# Patient Record
Sex: Female | Born: 1980 | Race: Black or African American | Hispanic: No | Marital: Single | Smoking: Former smoker
Health system: Southern US, Community
[De-identification: ages and names within clinical notes are randomized; demographics above are authoritative.]

## PROBLEM LIST (undated history)

## (undated) DIAGNOSIS — B009 Herpesviral infection, unspecified: Secondary | ICD-10-CM

## (undated) HISTORY — PX: NO PAST SURGERIES: SHX2092

---

## 2013-01-04 ENCOUNTER — Ambulatory Visit: Payer: Self-pay | Admitting: Internal Medicine

## 2014-09-29 ENCOUNTER — Ambulatory Visit
Admission: EM | Admit: 2014-09-29 | Discharge: 2014-09-29 | Disposition: A | Discharge status: Home / Self Care | Payer: BLUE CROSS/BLUE SHIELD | Attending: Family Medicine | Admitting: Family Medicine

## 2014-09-29 DIAGNOSIS — M67432 Ganglion, left wrist: Secondary | ICD-10-CM

## 2014-09-29 NOTE — ED Notes (Signed)
Pt states "I have had knot in my left wrist for the last two weeks. I was sent to urgent care in [place] by my employer, but it turned out to be cyst, it is very painful. My employer has now sent me here for another evaluation."

## 2014-09-29 NOTE — ED Provider Notes (Signed)
CSN: [id number]     Arrival date & time 09/29/14  1530 History   First MD Initiated Contact with Patient 09/29/14 1609     Chief Complaint  Patient presents with  . Wrist Pain   (Consider location/radiation/quality/duration/timing/severity/associated sxs/prior Treatment) Patient is a 34 y.o. female presenting with wrist pain.  Wrist Pain This is a new problem. The current episode started more than 1 week ago. The problem occurs constantly. Associated symptoms comments: Patient reports wrist pain and numbness of fingers related to lump on back of wrist for the past 2 weeks.Marland Kitchen.    History reviewed. No pertinent past medical history. History reviewed. No pertinent past surgical history. No family history on file. History  Substance Use Topics  . Smoking status: Light Tobacco Smoker    Types: Cigars  . Smokeless tobacco: Not on file  . Alcohol Use: Yes     Comment: rare   OB History    No data available     Review of Systems  Allergies  Naproxen and Percocet  Home Medications   Prior to Admission medications   Not on File   BP 130/78 mmHg  Pulse 93  Temp(Src) 98.2 F (36.8 C) (Tympanic)  Resp 16  Ht 5\' 3"  (1.6 m)  Wt 201 lb (91.173 kg)  BMI 35.61 kg/m2  SpO2 99%  LMP 08/29/2014 Physical Exam  Constitutional: She appears well-developed and well-nourished. No distress.  Musculoskeletal:       Right wrist: She exhibits tenderness (over subcutaneous soft tissue mass), swelling (mild) and deformity (soft tissue subcutaneous mass over dorsal medial aspect of wrist). She exhibits normal range of motion, no bony tenderness, no effusion, no crepitus and no laceration.  Skin: No rash noted. She is not diaphoretic.  Nursing note reviewed.   ED Course  Procedures (including critical care time) Labs Review Labs Reviewed - No data to display  Imaging Review No results found.   MDM   1. Ganglion of left wrist     Plan: 1. Diagnosis reviewed with patient 2. Recommend  supportive treatment with otc NSAIDS prn, warm compresses 3. Recommend follow up with hand orthopedic specialist for further evaluation and management  Payton Mccallumrlando Keylon Labelle, MD 09/29/14 [phone]

## 2014-09-29 NOTE — Discharge Instructions (Signed)

## 2015-01-17 ENCOUNTER — Encounter: Payer: Self-pay | Admitting: Emergency Medicine

## 2015-01-17 ENCOUNTER — Ambulatory Visit
Admission: EM | Admit: 2015-01-17 | Discharge: 2015-01-17 | Disposition: A | Discharge status: Home / Self Care | Payer: BLUE CROSS/BLUE SHIELD | Attending: Family Medicine | Admitting: Family Medicine

## 2015-01-17 DIAGNOSIS — M62830 Muscle spasm of back: Secondary | ICD-10-CM | POA: Diagnosis not present

## 2015-01-17 DIAGNOSIS — M5442 Lumbago with sciatica, left side: Secondary | ICD-10-CM

## 2015-01-17 MED ORDER — ORPHENADRINE CITRATE ER 100 MG PO TB12: 100.0000 mg | ORAL_TABLET | Freq: Two times a day (BID) | ORAL | Status: DC

## 2015-01-17 MED ORDER — KETOROLAC TROMETHAMINE 60 MG/2ML IM SOLN
60.0000 mg | Freq: Once | INTRAMUSCULAR | Status: AC
  Administered 2015-01-17: 60 mg via INTRAMUSCULAR

## 2015-01-17 MED ORDER — HYDROCODONE-ACETAMINOPHEN 5-325 MG PO TABS: 1.0000 | ORAL_TABLET | Freq: Three times a day (TID) | ORAL | Status: DC | PRN

## 2015-01-17 MED ORDER — MELOXICAM 15 MG PO TABS: 15.0000 mg | ORAL_TABLET | Freq: Every day | ORAL | Status: DC

## 2015-01-17 NOTE — ED Provider Notes (Signed)
CSN: [id number]     Arrival date & time 01/17/15  0945 History   First MD Initiated Contact with Patient 01/17/15 1037     Chief Complaint  Patient presents with  . Back Pain   (Consider location/radiation/quality/duration/timing/severity/associated sxs/prior Treatment) Patient is a 34 y.o. female presenting with back pain. The history is provided by the patient. No language interpreter was used.  Back Pain Location:  Sacro-iliac joint and lumbar spine Quality:  Aching Pain severity:  Moderate Onset quality:  Sudden Timing:  Constant Progression:  Worsening Chronicity:  New Context: occupational injury and physical stress   Relieved by:  Nothing Worsened by:  Bending and movement Ineffective treatments:  Ibuprofen Associated symptoms: no fever   Risk factors: no hx of cancer, no menopause, not obese and not pregnant     History reviewed. No pertinent past medical history. History reviewed. No pertinent past surgical history. History reviewed. No pertinent family history. Social History  Substance Use Topics  . Smoking status: Light Tobacco Smoker    Types: Cigars  . Smokeless tobacco: None  . Alcohol Use: Yes     Comment: rare   OB History    No data available     Review of Systems  Constitutional: Positive for activity change. Negative for fever and fatigue.  Genitourinary: Positive for frequency.       Frequency occurred before she started having back pain.  Musculoskeletal: Positive for myalgias and back pain.  Neurological: Negative for dizziness.  Psychiatric/Behavioral: Positive for sleep disturbance.  All other systems reviewed and are negative.   Allergies  Naproxen and Percocet  Home Medications   Prior to Admission medications   Medication Sig Start Date End Date Taking? Authorizing Provider  acyclovir (ZOVIRAX) 200 MG capsule Take 200 mg by mouth as needed.   Yes Historical Provider, MD  HYDROcodone-acetaminophen (NORCO) 5-325 MG per tablet Take 1  tablet by mouth every 8 (eight) hours as needed for moderate pain. 01/17/15   Hassan Rowan, MD  meloxicam (MOBIC) 15 MG tablet Take 1 tablet (15 mg total) by mouth daily. 01/17/15   Hassan Rowan, MD  orphenadrine (NORFLEX) 100 MG tablet Take 1 tablet (100 mg total) by mouth 2 (two) times daily. 01/17/15   Hassan Rowan, MD   BP 109/71 mmHg  Pulse 91  Temp(Src) 97.4 F (36.3 C) (Tympanic)  Resp 16  Ht 5\' 3"  (1.6 m)  Wt 200 lb (90.719 kg)  BMI 35.44 kg/m2  SpO2 100%  LMP 12/27/2014 (Approximate) Physical Exam  Constitutional: She is oriented to person, place, and time. She appears well-developed and well-nourished. She appears distressed.  HENT:  Head: Normocephalic and atraumatic.  Eyes: Pupils are equal, round, and reactive to light.  Musculoskeletal: She exhibits tenderness. She exhibits no edema.       Lumbar back: She exhibits tenderness, pain and spasm.       Back:  Most the pain is on lumbar area and left iliac sacral area as well. Marked muscle spasm noted over the left iliac sacral area.   Reflexes basically equal the patient shows weakness in the left lower leg compared to the right.  Neurological: She is alert and oriented to person, place, and time. She has normal reflexes.  Skin: Skin is warm and dry. No rash noted. No erythema.  Psychiatric: She has a normal mood and affect. Her behavior is normal.  Vitals reviewed.   ED Course  Procedures (including critical care time) Labs Review Labs Reviewed - No data  to display  Imaging Review No results found.   MDM   1. Left-sided low back pain with left-sided sciatica   2. Muscle spasm of back       Patient has expressed dissatisfaction with the physician she saw Friday at the urgent care [place].   I have informed patient that there are going to be some issues since she is ready has filed [employer]. with another carrier. She states she was seen here today. She also understands that she is following her regular  insurance for this visit. We will treat her as a nonwork injury which will limit we can offer as far as time off from work and restrictions at work. She still has asked [place] to proceed with treatment and evaluation.  Muscle spasm, back pain, sciatica. We'll place her on Mobic, Norflex, and hydrocodone for pain and muscle spasm. Warned that the hydrocodone should be taken mostly at night and that it could cause sedation. While she is listed an allergy to Naprosyn should be noted that allergy is more of a GI distress that occurs when she takes Naprosyn. She can take ibuprofen fine. We'll place her on the Mobic since is easier and stomach warned that she still needs to have some food in stomach before she takes the Mobic. While she is allergic to Percocet she has reported to be that she has taken Vicodin for dental work in the past and never had trouble with Vicodin. Denies any chance of pregnancy at this time  Because there is no true allergic reaction to an inflammatory will administer shot of Toradol today.  Also explained why we will not do x-rays on her back this time either.      Hassan Rowan, MD 01/17/15 [phone]

## 2015-01-17 NOTE — ED Notes (Signed)
Patient c/o lower back pain since Thursday.  Patient states that she injured her back at work on Thursday and was seen at an Urgent Care in [place].

## 2015-01-17 NOTE — Discharge Instructions (Signed)
Back Exercises Back exercises help treat and prevent back injuries. The goal is to increase your strength in your belly (abdominal) and back muscles. These exercises can also help with flexibility. Start these exercises when told by your doctor. HOME CARE Back exercises include: Pelvic Tilt.  Lie on your back with your knees bent. Tilt your pelvis until the lower part of your back is against the floor. Hold this position 5 to 10 sec. Repeat this exercise 5 to 10 times. Knee to Chest.  Pull 1 knee up against your chest and hold for 20 to 30 seconds. Repeat this with the other knee. This may be done with the other leg straight or bent, whichever feels better. Then, pull both knees up against your chest. Sit-Ups or Curl-Ups.  Bend your knees 90 degrees. Start with tilting your pelvis, and do a partial, slow sit-up. Only lift your upper half 30 to 45 degrees off the floor. Take at least 2 to 3 seonds for each sit-up. Do not do sit-ups with your knees out straight. If partial sit-ups are difficult, simply do the above but with only tightening your belly (abdominal) muscles and holding it as told. Hip-Lift.  Lie on your back with your knees flexed 90 degrees. Push down with your feet and shoulders as you raise your hips 2 inches off the floor. Hold for 10 seconds, repeat 5 to 10 times. Back Arches.  Lie on your stomach. Prop yourself up on bent elbows. Slowly press on your hands, causing an arch in your low back. Repeat 3 to 5 times. Shoulder-Lifts.  Lie face down with arms beside your body. Keep hips and belly pressed to floor as you slowly lift your head and shoulders off the floor. Do not overdo your exercises. Be careful in the beginning. Exercises may cause you some mild back discomfort. If the pain lasts for more than 15 minutes, stop the exercises until you see your doctor. Improvement with exercise for back problems is slow.  Document Released: 06/16/2010 Document Revised: 08/06/2011  Document Reviewed: 03/15/2011 [facility] Patient Information 2015 [place], [place]. This information is not intended to replace advice given to you by your health care provider. Make sure you discuss any questions you have with your health care provider.  Back Pain, Adult Back pain is very common. The pain often gets better over time. The cause of back pain is usually not dangerous. Most people can learn to manage their back pain on their own.  HOME CARE   Stay active. Start with short walks on flat ground if you can. Try to walk farther each day.  Do not sit, drive, or stand in one place for more than 30 minutes. Do not stay in bed.  Do not avoid exercise or work. Activity can help your back heal faster.  Be careful when you bend or lift an object. Bend at your knees, keep the object close to you, and do not twist.  Sleep on a firm mattress. Lie on your side, and bend your knees. If you lie on your back, put a pillow under your knees.  Only take medicines as told by your doctor.  Put ice on the injured area.  Put ice in a plastic bag.  Place a towel between your skin and the bag.  Leave the ice on for 15-20 minutes, 03-04 times a day for the first 2 to 3 days. After that, you can switch between ice and heat packs.  Ask your doctor about back exercises  or massage.  Avoid feeling anxious or stressed. Find good ways to deal with stress, such as exercise. GET HELP RIGHT AWAY IF:   Your pain does not go away with rest or medicine.  Your pain does not go away in 1 week.  You have new problems.  You do not feel well.  The pain spreads into your legs.  You cannot control when you poop (bowel movement) or pee (urinate).  Your arms or legs feel weak or lose feeling (numbness).  You feel sick to your stomach (nauseous) or throw up (vomit).  You have belly (abdominal) pain.  You feel like you may pass out (faint). MAKE SURE YOU:   Understand these instructions.  Will watch  your condition.  Will get help right away if you are not doing well or get worse. Document Released: 10/31/2007 Document Revised: 08/06/2011 Document Reviewed: 09/15/2013 [facility] Patient Information 2015 [place], [place]. This information is not intended to replace advice given to you by your health care provider. Make sure you discuss any questions you have with your health care provider.  Sciatica Sciatica is pain, weakness, numbness, or tingling along your sciatic nerve. The nerve starts in the lower back and runs down the back of each leg. Nerve damage or certain conditions pinch or put pressure on the sciatic nerve. This causes the pain, weakness, and other discomforts of sciatica. HOME CARE   Only take medicine as told by your doctor.  Apply ice to the affected area for 20 minutes. Do this 3-4 times a day for the first 48-72 hours. Then try heat in the same way.  Exercise, stretch, or do your usual activities if these do not make your pain worse.  Go to physical therapy as told by your doctor.  Keep all doctor visits as told.  Do not wear high heels or shoes that are not supportive.  Get a firm mattress if your mattress is too soft to lessen pain and discomfort. GET HELP RIGHT AWAY IF:   You cannot control when you poop (bowel movement) or pee (urinate).  You have more weakness in your lower back, lower belly (pelvis), butt (buttocks), or legs.  You have redness or puffiness (swelling) of your back.  You have a burning feeling when you pee.  You have pain that gets worse when you lie down.  You have pain that wakes you from your sleep.  Your pain is worse than past pain.  Your pain lasts longer than 4 weeks.  You are suddenly losing weight without reason. MAKE SURE YOU:   Understand these instructions.  Will watch this condition.  Will get help right away if you are not doing well or get worse. Document Released: 02/21/2008 Document Revised: 11/13/2011 Document  Reviewed: 09/23/2011 [facility] Patient Information 2015 [place], [place]. This information is not intended to replace advice given to you by your health care provider. Make sure you discuss any questions you have with your health care provider.

## 2016-05-29 ENCOUNTER — Ambulatory Visit
Admission: EM | Admit: 2016-05-29 | Discharge: 2016-05-29 | Disposition: A | Discharge status: Home / Self Care | Payer: Managed Care, Other (non HMO) | Attending: Family Medicine | Admitting: Family Medicine

## 2016-05-29 DIAGNOSIS — A09 Infectious gastroenteritis and colitis, unspecified: Secondary | ICD-10-CM

## 2016-05-29 DIAGNOSIS — R1033 Periumbilical pain: Secondary | ICD-10-CM | POA: Diagnosis not present

## 2016-05-29 DIAGNOSIS — R197 Diarrhea, unspecified: Secondary | ICD-10-CM

## 2016-05-29 LAB — OCCULT BLOOD X 1 CARD TO LAB, STOOL: Fecal Occult Bld: NEGATIVE

## 2016-05-29 MED ORDER — CIPROFLOXACIN HCL 500 MG PO TABS: 500.0000 mg | ORAL_TABLET | Freq: Two times a day (BID) | ORAL | 0 refills | Status: DC

## 2016-05-29 NOTE — ED Triage Notes (Signed)
Patient complains of mid abdominal pain and patient states that she has noticed some blood on her stool and when she wipes. Patient states that the patient started on with the pain 4 days ago. Patient states that she has had this pain before and was placed on "black and yellow" tablets that helped. Patient states that she has only noticed the blood a few times.

## 2016-05-29 NOTE — ED Provider Notes (Signed)
CSN: [id number]     Arrival date & time 05/29/16  1244 History   First MD Initiated Contact with Patient 05/29/16 1541     Chief Complaint  Patient presents with  . Abdominal Pain   (Consider location/radiation/quality/duration/timing/severity/associated sxs/prior Treatment) HPI  This a 36 year old female who presents with mid abdominal crampy pain started after she ate some warm chicken salad approximate 4 days ago. So time she's had some watery diarrhea at times with blood and mucus. He's had no nausea or vomiting. November she had a similar attack she was given some black and yellow pills by her physician which seemed to alleviate her problem. Her pain is mid abdomen and has a cramping quality to it. It is not constant but fluctuates in intensity.       History reviewed. No pertinent past medical history. Past Surgical History:  Procedure Laterality Date  . NO PAST SURGERIES     History reviewed. No pertinent family history. Social History  Substance Use Topics  . Smoking status: Former Smoker    Types: Cigars  . Smokeless tobacco: Never Used  . Alcohol use Yes     Comment: rare   OB History    No data available     Review of Systems  Constitutional: Positive for activity change and appetite change. Negative for chills, fatigue and fever.  Gastrointestinal: Positive for abdominal pain, blood in stool and diarrhea. Negative for constipation, nausea and vomiting.  All other systems reviewed and are negative.   Allergies  Naproxen and Percocet [oxycodone-acetaminophen]  Home Medications   Prior to Admission medications   Medication Sig Start Date End Date Taking? Authorizing Provider  acyclovir (ZOVIRAX) 200 MG capsule Take 200 mg by mouth as needed.   Yes Historical Provider, MD  doxycycline (VIBRAMYCIN) 100 MG capsule Take 100 mg by mouth 2 (two) times daily.   Yes Historical Provider, MD  ciprofloxacin (CIPRO) 500 MG tablet Take 1 tablet (500 mg total) by mouth 2  (two) times daily. 05/29/16   Lutricia Feil, PA-C   Meds Ordered and Administered this Visit  Medications - No data to display  BP 115/70 (BP Location: Left Arm)   Pulse 91   Temp 98.2 F (36.8 C) (Oral)   Resp 16   Ht 5\' 5"  (1.651 m)   Wt 201 lb (91.2 kg)   LMP 05/11/2016   SpO2 100%   BMI 33.45 kg/m  No data found.   Physical Exam  Constitutional: She is oriented to person, place, and time. She appears well-developed and well-nourished. No distress.  HENT:  Head: Normocephalic and atraumatic.  Eyes: EOM are normal. Pupils are equal, round, and reactive to light.  Neck: Normal range of motion. Neck supple.  Abdominal: Soft. Bowel sounds are normal. She exhibits no distension and no mass. There is no tenderness. There is no rebound and no guarding. No hernia.  Examination was performed in the presence of Melissa, CMA acting as chaperone and assisted. There is no distention of the abdomen. Bowel sounds are present in all 4 quadrants. There is no rebound or guarding. There is mild tenderness over the periumbilical region. Rectal exam was then performed showing very small amount of stool in the rectal vault. No external hemorrhoids/ fissures were seen. Stool guaiac was sent to laboratory  Genitourinary: Rectal exam shows guaiac negative stool.  Musculoskeletal: Normal range of motion.  Neurological: She is alert and oriented to person, place, and time.  Skin: Skin is warm and dry.  She is not diaphoretic.  Psychiatric: She has a normal mood and affect. Her behavior is normal. Judgment and thought content normal.  Nursing note and vitals reviewed.   Urgent Care Course   Clinical Course     Procedures (including critical care time)  Labs Review Labs Reviewed  OCCULT BLOOD X 1 CARD TO LAB, STOOL    Imaging Review No results found.   Visual Acuity Review  Right Eye Distance:   Left Eye Distance:   Bilateral Distance:    Right Eye Near:   Left Eye Near:    Bilateral  Near:         MDM   1. Periumbilical abdominal pain   2. Diarrhea of presumed infectious origin    New Prescriptions   CIPROFLOXACIN (CIPRO) 500 MG TABLET    Take 1 tablet (500 mg total) by mouth 2 (two) times daily.  Plan: 1. Test/x-ray results and diagnosis reviewed with patient 2. rx as per orders; risks, benefits, potential side effects reviewed with patient 3. Recommend supportive treatment with Increased hydration to keep urine clear. Advance diet as tolerated beginning with brat. If not improving follow-up with primary care physician or to the clinic.  4. F/u prn if symptoms worsen or don't improve     Lutricia FeilWilliam P Rabia Argote, PA-C 05/29/16 1632

## 2016-10-03 ENCOUNTER — Ambulatory Visit
Admission: EM | Admit: 2016-10-03 | Discharge: 2016-10-03 | Disposition: A | Discharge status: Home / Self Care | Payer: Managed Care, Other (non HMO) | Attending: Family Medicine | Admitting: Family Medicine

## 2016-10-03 DIAGNOSIS — L03211 Cellulitis of face: Secondary | ICD-10-CM

## 2016-10-03 DIAGNOSIS — W57XXXA Bitten or stung by nonvenomous insect and other nonvenomous arthropods, initial encounter: Secondary | ICD-10-CM | POA: Diagnosis not present

## 2016-10-03 MED ORDER — SULFAMETHOXAZOLE-TRIMETHOPRIM 800-160 MG PO TABS: 1.0000 | ORAL_TABLET | Freq: Two times a day (BID) | ORAL | 0 refills | Status: DC

## 2016-10-03 NOTE — ED Provider Notes (Signed)
[facility]    CSN: [id number] Arrival date & time: 10/03/16  0814     History   Chief Complaint Chief Complaint  Patient presents with  . Insect Bite    HPI Autumn Cross is a 36 y.o. female.   36 yo female with a c/o insect bite to forehead several days ago and now with spreading redness and pain to the area. Denies any drainage, fevers, chills.    The history is provided by the patient.    History reviewed. No pertinent past medical history.  There are no active problems to display for this patient.   Past Surgical History:  Procedure Laterality Date  . NO PAST SURGERIES      OB History    No data available       Home Medications    Prior to Admission medications   Medication Sig Start Date End Date Taking? Authorizing Provider  doxycycline (VIBRAMYCIN) 100 MG capsule Take 100 mg by mouth 2 (two) times daily.   Yes [provider]  acyclovir (ZOVIRAX) 200 MG capsule Take 200 mg by mouth as needed.    [provider]  ciprofloxacin (CIPRO) 500 MG tablet Take 1 tablet (500 mg total) by mouth 2 (two) times daily. 05/29/16   Lutricia Feiloemer, William P, PA-C  sulfamethoxazole-trimethoprim (BACTRIM DS,SEPTRA DS) 800-160 MG tablet Take 1 tablet by mouth 2 (two) times daily. 10/03/16   Payton Mccallumonty, [place] Stenberg, MD    Family History History reviewed. No pertinent family history.  Social History Social History  Substance Use Topics  . Smoking status: Former Smoker    Types: Cigars  . Smokeless tobacco: Never Used  . Alcohol use Yes     Comment: rare     Allergies   Naproxen and Percocet [oxycodone-acetaminophen]   Review of Systems Review of Systems   Physical Exam Triage Vital Signs ED Triage Vitals  Enc Vitals Group     BP 10/03/16 0901 118/72     Pulse Rate 10/03/16 0901 70     Resp 10/03/16 0901 16     Temp 10/03/16 0901 98.7 F (37.1 C)     Temp Source 10/03/16 0901 Oral     SpO2 10/03/16 0901 98 %     Weight 10/03/16 0859  200 lb (90.7 kg)     Height 10/03/16 0859 5\' 4"  (1.626 m)     Head Circumference --      Peak Flow --      Pain Score 10/03/16 0859 0     Pain Loc --      Pain Edu? --      Excl. in GC? --    No data found.   Updated Vital Signs BP 118/72 (BP Location: Left Arm)   Pulse 70   Temp 98.7 F (37.1 C) (Oral)   Resp 16   Ht 5\' 4"  (1.626 m)   Wt 200 lb (90.7 kg)   LMP 09/06/2016   SpO2 98%   BMI 34.33 kg/m   Visual Acuity Right Eye Distance: 20/200 (uncorrected) Left Eye Distance: 20/70 (uncorrected) Bilateral Distance: 20/70 (uncorrected)  Right Eye Near:   Left Eye Near:    Bilateral Near:     Physical Exam  Constitutional: She appears well-developed and well-nourished. No distress.  Skin: Skin is warm. She is not diaphoretic. There is erythema.  3x4cm skin area on forehead with blanchable erythema, warmth and tenderness to palpation  Nursing note and vitals reviewed.    UC Treatments /  Results  Labs (all labs ordered are listed, but only abnormal results are displayed) Labs Reviewed - No data to display  EKG  EKG Interpretation None       Radiology No results found.  Procedures Procedures (including critical care time)  Medications Ordered in UC Medications - No data to display   Initial Impression / Assessment and Plan / UC Course  I have reviewed the triage vital signs and the nursing notes.  Pertinent labs & imaging results that were available during my care of the patient were reviewed by me and considered in my medical decision making (see chart for details).       Final Clinical Impressions(s) / UC Diagnoses   Final diagnoses:  Insect bite, initial encounter  Cellulitis of forehead    New Prescriptions Discharge Medication List as of 10/03/2016  9:27 AM    START taking these medications   Details  sulfamethoxazole-trimethoprim (BACTRIM DS,SEPTRA DS) 800-160 MG tablet Take 1 tablet by mouth 2 (two) times daily., Starting Wed  10/03/2016, Normal       1. diagnosis reviewed with patient 2. rx as per orders above; reviewed possible side effects, interactions, risks and benefits  3. Recommend supportive treatment with warm compresses 4. Follow-up prn if symptoms worsen or don't improve   Payton Mccallum, MD 10/03/16 1055

## 2016-10-03 NOTE — ED Triage Notes (Addendum)
Patient complains of insect bite of her forehead. Patient states that she noticed the bite yesterday and believes it may have occurred over night on Monday. Patient states that she also has a knot behind her left ear. Patient also complains of some right ear pain.  Patient also complains of bilateral eye redness.

## 2016-12-05 ENCOUNTER — Encounter: Payer: Self-pay | Admitting: Obstetrics and Gynecology

## 2017-05-13 ENCOUNTER — Other Ambulatory Visit: Payer: Self-pay

## 2017-05-13 ENCOUNTER — Ambulatory Visit
Admission: EM | Admit: 2017-05-13 | Discharge: 2017-05-13 | Disposition: A | Discharge status: Home / Self Care | Payer: Managed Care, Other (non HMO) | Attending: Family Medicine | Admitting: Family Medicine

## 2017-05-13 ENCOUNTER — Encounter: Payer: Self-pay | Admitting: *Deleted

## 2017-05-13 DIAGNOSIS — L0231 Cutaneous abscess of buttock: Secondary | ICD-10-CM

## 2017-05-13 DIAGNOSIS — G44209 Tension-type headache, unspecified, not intractable: Secondary | ICD-10-CM | POA: Diagnosis not present

## 2017-05-13 DIAGNOSIS — L0291 Cutaneous abscess, unspecified: Secondary | ICD-10-CM

## 2017-05-13 DIAGNOSIS — L02214 Cutaneous abscess of groin: Secondary | ICD-10-CM | POA: Diagnosis not present

## 2017-05-13 HISTORY — DX: Herpesviral infection, unspecified: B00.9

## 2017-05-13 MED ORDER — DOXYCYCLINE HYCLATE 100 MG PO CAPS: 100.0000 mg | ORAL_CAPSULE | Freq: Two times a day (BID) | ORAL | 0 refills | Status: DC

## 2017-05-13 MED ORDER — BUTALBITAL-APAP-CAFFEINE 50-325-40 MG PO TABS: 1.0000 | ORAL_TABLET | Freq: Four times a day (QID) | ORAL | 0 refills | Status: DC | PRN

## 2017-05-13 NOTE — ED Provider Notes (Signed)
[facility]    CSN: [id number] Arrival date & time: 05/13/17  1621  History   Chief Complaint Chief Complaint  Patient presents with  . Abscess  . Headache  . Eye Problem   HPI  36 year old female presents with the above complaints.  Patient reports that over the past several days she has had developing "boils" in her left inguinal region and on her right buttock.  Very tender and painful.  No drainage.  No medications or interventions tried.  She states that she has not had a history of this in the past.  Additionally, over the past few weeks she has had ongoing intermittent headaches.  She states that the pain is located throughout her whole head.  She states that it feels "tight".  She has been using ibuprofen without resolution.  She states that when she has headaches she also notices that a portion of her eye is red.  No itching or pain.  No changes in her vision.  No known exacerbating factors.  No other reported symptoms.  No other complaints at this time.  Past Medical History:  Diagnosis Date  . Herpes    Past Surgical History:  Procedure Laterality Date  . NO PAST SURGERIES     OB History    No data available     Home Medications    Prior to Admission medications   Medication Sig Start Date End Date Taking? Authorizing Provider  butalbital-acetaminophen-caffeine (FIORICET, ESGIC) [phone] MG tablet Take 1 tablet by mouth every 6 (six) hours as needed for headache. 05/13/17 05/13/18  Tommie Samsook, Sianne Tejada G, DO  doxycycline (VIBRAMYCIN) 100 MG capsule Take 1 capsule (100 mg total) by mouth 2 (two) times daily. 05/13/17   Tommie Samsook, Sharmarke Cicio G, DO   Family History History reviewed. No pertinent family history.  Social History Social History   Tobacco Use  . Smoking status: Former Smoker    Types: Cigars  . Smokeless tobacco: Never Used  Substance Use Topics  . Alcohol use: Yes    Comment: rare  . Drug use: No     Allergies   Naproxen and Percocet  [oxycodone-acetaminophen]   Review of Systems Review of Systems  Eyes: Positive for redness.  Skin:       "Boils".  Neurological: Positive for headaches.   Physical Exam Triage Vital Signs ED Triage Vitals  Enc Vitals Group     BP 05/13/17 1712 124/81     Pulse Rate 05/13/17 1712 82     Resp 05/13/17 1712 16     Temp 05/13/17 1712 98.6 F (37 C)     Temp Source 05/13/17 1712 Oral     SpO2 05/13/17 1712 98 %     Weight 05/13/17 1713 212 lb (96.2 kg)     Height 05/13/17 1713 5\' 5"  (1.651 m)     Head Circumference --      Peak Flow --      Pain Score 05/13/17 1713 9     Pain Loc --      Pain Edu? --      Excl. in GC? --    Updated Vital Signs BP 124/81 (BP Location: Left Arm)   Pulse 82   Temp 98.6 F (37 C) (Oral)   Resp 16   Ht 5\' 5"  (1.651 m)   Wt 212 lb (96.2 kg)   LMP 04/29/2017   SpO2 98%   BMI 35.28 kg/m   Visual Acuity Right Eye Distance: 20/25 Left Eye Distance:  20/25 Bilateral Distance: 20/20(corrected with lenses)  Physical Exam  Constitutional: She is oriented to person, place, and time. She appears well-developed. No distress.  Eyes: EOM are normal. Pupils are equal, round, and reactive to light. Right eye exhibits no discharge. Left eye exhibits no discharge.  Left eye with a small portion of medial redness.  Cardiovascular: Normal rate and regular rhythm.  No murmur heard. Pulmonary/Chest: Effort normal and breath sounds normal. She has no wheezes. She has no rales.  Neurological: She is alert and oriented to person, place, and time.  No apparent focal deficits.  Skin:  2 small areas of induration noted in the left inguinal region.  No surrounding erythema.  No fluctuance.  No drainage.  Approximately 1.5 cm circumferential area of induration noted on the right buttock.  No redness.  Minimal fluctuance.  No drainage.  Psychiatric: She has a normal mood and affect. Her behavior is normal.  Vitals reviewed.    UC Treatments / Results   Labs (all labs ordered are listed, but only abnormal results are displayed) Labs Reviewed - No data to display  EKG  EKG Interpretation None       Radiology No results found.  Procedures Procedures (including critical care time)  Medications Ordered in UC Medications - No data to display   Initial Impression / Assessment and Plan / UC Course  I have reviewed the triage vital signs and the nursing notes.  Pertinent labs & imaging results that were available during my care of the patient were reviewed by me and considered in my medical decision making (see chart for details).     36 year old female presents with abscess and history consistent with tension headache.  Treating with doxycycline and Fioricet.  Vision is normal.  Advised her to follow-up with her ophthalmologist.  Final Clinical Impressions(s) / UC Diagnoses   Final diagnoses:  Tension headache  Abscess    ED Discharge Orders        Ordered    doxycycline (VIBRAMYCIN) 100 MG capsule  2 times daily     05/13/17 1810    butalbital-acetaminophen-caffeine (FIORICET, ESGIC) 50-325-40 MG tablet  Every 6 hours PRN     05/13/17 1810     Controlled Substance Prescriptions [place] Controlled Substance Registry consulted? Not Applicable   Tommie SamsCook, Deshon Hsiao G, DO 05/13/17 1824

## 2017-05-13 NOTE — ED Triage Notes (Signed)
Patient started having symptom of severe headache and eye redness 1 week ago. 5 days ago two abscess started on patient right leg and one on her right buttock 2 days ago.

## 2017-05-13 NOTE — Discharge Instructions (Signed)
Please see your eye doctor.  Headaches appear to be tension headaches. Try the fioricet.  Doxycycline as prescribed for your developing abscesses.  Take care  Dr. Adriana Simasook

## 2017-07-29 ENCOUNTER — Ambulatory Visit
Admission: EM | Admit: 2017-07-29 | Discharge: 2017-07-29 | Disposition: A | Discharge status: Home / Self Care | Payer: Managed Care, Other (non HMO) | Attending: Family Medicine | Admitting: Family Medicine

## 2017-07-29 ENCOUNTER — Other Ambulatory Visit: Payer: Self-pay

## 2017-07-29 ENCOUNTER — Encounter: Payer: Self-pay | Admitting: Emergency Medicine

## 2017-07-29 DIAGNOSIS — H9191 Unspecified hearing loss, right ear: Secondary | ICD-10-CM

## 2017-07-29 DIAGNOSIS — J069 Acute upper respiratory infection, unspecified: Secondary | ICD-10-CM | POA: Diagnosis not present

## 2017-07-29 DIAGNOSIS — H6123 Impacted cerumen, bilateral: Secondary | ICD-10-CM

## 2017-07-29 MED ORDER — HYDROCOD POLST-CPM POLST ER 10-8 MG/5ML PO SUER: 5.0000 mL | Freq: Two times a day (BID) | ORAL | 0 refills | Status: DC

## 2017-07-29 MED ORDER — BENZONATATE 200 MG PO CAPS: ORAL_CAPSULE | ORAL | 0 refills | Status: DC

## 2017-07-29 NOTE — ED Triage Notes (Signed)
Patient c/o cough and chest congestion since Thursday.  Patient c/o pressure in her right ear that started a week ago.

## 2017-07-29 NOTE — ED Provider Notes (Signed)
[facility]    CSN: [id number] Arrival date & time: 07/29/17  1244     History   Chief Complaint Chief Complaint  Patient presents with  . Cough  . Otalgia    HPI Autumn Cross is a 37 y.o. female.   HPI  37 year old female presents with cough and chest congestion for 4 days.  Has been productive of only white sputum with black specks according to the patient.  Had no shortness of breath and has had no wheezing  Second problem is that of pressure in her right ear that started a week ago.  Has had no discharge.  He can barely hear out of her ear.       Past Medical History:  Diagnosis Date  . Herpes     There are no active problems to display for this patient.   Past Surgical History:  Procedure Laterality Date  . NO PAST SURGERIES      OB History    No data available       Home Medications    Prior to Admission medications   Medication Sig Start Date End Date Taking? Authorizing Provider  benzonatate (TESSALON) 200 MG capsule Take one cap TID PRN cough 07/29/17   Lutricia Feiloemer, Tillmon Kisling P, PA-C  chlorpheniramine-HYDROcodone [facility](TUSSIONEX PENNKINETIC ER) 10-8 MG/5ML SUER Take 5 mLs by mouth 2 (two) times daily. 07/29/17   Lutricia Feiloemer, Avina Eberle P, PA-C    Family History History reviewed. No pertinent family history.  Social History Social History   Tobacco Use  . Smoking status: Former Smoker    Types: Cigars  . Smokeless tobacco: Never Used  Substance Use Topics  . Alcohol use: Yes    Comment: rare  . Drug use: No     Allergies   Naproxen and Percocet [oxycodone-acetaminophen]   Review of Systems Review of Systems  Constitutional: Positive for activity change. Negative for chills, fatigue and fever.  HENT: Positive for congestion, ear pain and hearing loss.   Respiratory: Positive for cough.   All other systems reviewed and are negative.    Physical Exam Triage Vital Signs ED Triage Vitals  Enc Vitals Group     BP 07/29/17 1315  135/79     Pulse Rate 07/29/17 1315 93     Resp 07/29/17 1315 16     Temp 07/29/17 1315 98.1 F (36.7 C)     Temp Source 07/29/17 1315 Oral     SpO2 07/29/17 1315 100 %     Weight 07/29/17 1313 203 lb (92.1 kg)     Height 07/29/17 1313 5\' 4"  (1.626 m)     Head Circumference --      Peak Flow --      Pain Score 07/29/17 1313 9     Pain Loc --      Pain Edu? --      Excl. in GC? --    No data found.  Updated Vital Signs BP 135/79 (BP Location: Left Arm)   Pulse 93   Temp 98.1 F (36.7 C) (Oral)   Resp 16   Ht 5\' 4"  (1.626 m)   Wt 203 lb (92.1 kg)   LMP 07/12/2017 (Approximate)   SpO2 100%   BMI 34.84 kg/m   Visual Acuity Right Eye Distance:   Left Eye Distance:   Bilateral Distance:    Right Eye Near:   Left Eye Near:    Bilateral Near:     Physical Exam  Constitutional: She is oriented  to person, place, and time. She appears well-developed and well-nourished. No distress.  HENT:  Head: Normocephalic.  Both ears are occluded with cerumen  Eyes: Pupils are equal, round, and reactive to light. Right eye exhibits no discharge. Left eye exhibits no discharge.  Neck: Normal range of motion.  Pulmonary/Chest: Effort normal and breath sounds normal.  Musculoskeletal: Normal range of motion.  Neurological: She is alert and oriented to person, place, and time.  Skin: Skin is warm and dry. She is not diaphoretic.  Psychiatric: She has a normal mood and affect. Her behavior is normal. Judgment and thought content normal.  Nursing note and vitals reviewed.    UC Treatments / Results  Labs (all labs ordered are listed, but only abnormal results are displayed) Labs Reviewed - No data to display  EKG  EKG Interpretation None       Radiology No results found.  Procedures Procedures (including critical care time)  Medications Ordered in UC Medications - No data to display   Initial Impression / Assessment and Plan / UC Course  I have reviewed the triage  vital signs and the nursing notes.  Pertinent labs & imaging results that were available during my care of the patient were reviewed by me and considered in my medical decision making (see chart for details).     Plan: 1. Test/x-ray results and diagnosis reviewed with patient 2. rx as per orders; risks, benefits, potential side effects reviewed with patient 3. Recommend supportive treatment with rest fluids use of cough pills and syrup to control coughing.  Temps to the cerumen impaction were not completely successful and recommend that you follow-up with an ear nose and throat specialist for further cleaning.  If your cough does not improve or if it worsens in the next week or to return to our clinic for further evaluation and care. 4. F/u prn if symptoms worsen or don't improve   Final Clinical Impressions(s) / UC Diagnoses   Final diagnoses:  Upper respiratory tract infection, unspecified type  Bilateral impacted cerumen    ED Discharge Orders        Ordered    benzonatate (TESSALON) 200 MG capsule     07/29/17 1453    chlorpheniramine-HYDROcodone (TUSSIONEX PENNKINETIC ER) 10-8 MG/5ML SUER  2 times daily     07/29/17 1453       Controlled Substance Prescriptions [place] Controlled Substance Registry consulted? Not Applicable   Lutricia Feil, PA-C 07/29/17 1610

## 2018-04-07 ENCOUNTER — Encounter: Payer: Self-pay | Admitting: Emergency Medicine

## 2018-04-07 ENCOUNTER — Ambulatory Visit
Admission: EM | Admit: 2018-04-07 | Discharge: 2018-04-07 | Disposition: A | Discharge status: Home / Self Care | Payer: Managed Care, Other (non HMO) | Attending: Family Medicine | Admitting: Family Medicine

## 2018-04-07 ENCOUNTER — Ambulatory Visit (INDEPENDENT_AMBULATORY_CARE_PROVIDER_SITE_OTHER): Payer: Managed Care, Other (non HMO)

## 2018-04-07 ENCOUNTER — Other Ambulatory Visit: Payer: Self-pay

## 2018-04-07 DIAGNOSIS — M79672 Pain in left foot: Secondary | ICD-10-CM

## 2018-04-07 MED ORDER — MELOXICAM 15 MG PO TABS: 15.0000 mg | ORAL_TABLET | Freq: Every day | ORAL | 0 refills | Status: DC | PRN

## 2018-04-07 NOTE — Discharge Instructions (Addendum)
Take medication as prescribed. Rest. Drink plenty of fluids. Good supportive shoes.   Follow up with your primary care physician this week as needed. Return to Urgent care for new or worsening concerns.

## 2018-04-07 NOTE — ED Provider Notes (Signed)
[facility] ____________________________________________  Time seen: Approximately 2:51 PM  I have reviewed the triage vital signs and the nursing notes.  HISTORY  Chief Complaint Foot Pain (left)  HPI Autumn Cross is a 37 y.o. female presented for evaluation of pain to the top of her left foot present intermittently for the last 2 months.  Patient states that she noticed the pain after working multiple days in a row and can visually see swelling and feel almost a knot to that area.  States after not having to work for a few days the area resolves and feels better.  States that she has to wear steel toed boots at work that rubbed this area.  Denies any fall, direct injury or direct trauma.  States minimal discomfort currently, moderately when more present.  Unresolved with ice and massage.  Denies any pain radiation.  Denies any swelling otherwise to lower extremity.  Denies rash or redness, insect bite or break in skin.  Denies history of similar.  Denies any recent immobilization, long trips, hemoptysis, chest pain, shortness of breath, paresthesias, cancer, personal or family history of DVTs. Not on any oral contraceptives.  Denies chance of current pregnancy.  Has continue to remain ambulatory.  Reports otherwise doing well denies other complaints.  Patient's last menstrual period was 03/16/2018 (approximate).Denies current pregnancy.    Past Medical History:  Diagnosis Date  . Herpes     There are no active problems to display for this patient.   Past Surgical History:  Procedure Laterality Date  . NO PAST SURGERIES       No current facility-administered medications for this encounter.   Current Outpatient Medications:  .  acyclovir (ZOVIRAX) 200 MG capsule, Take by mouth., Disp: , Rfl:  .  meloxicam (MOBIC) 15 MG tablet, Take 1 tablet (15 mg total) by mouth daily as needed., Disp: 10 tablet, Rfl: 0  Allergies Naproxen and Percocet  [oxycodone-acetaminophen]  Family History  Problem Relation Age of Onset  . Healthy Mother   . Healthy Father     Social History Social History   Tobacco Use  . Smoking status: Former Smoker    Types: Cigars    Last attempt to quit: 04/07/2017    Years since quitting: 1.0  . Smokeless tobacco: Never Used  Substance Use Topics  . Alcohol use: Yes    Comment: rare  . Drug use: No    Review of Systems Constitutional: No fever Cardiovascular: Denies chest pain. Respiratory: Denies shortness of breath. Gastrointestinal: No abdominal pain. Musculoskeletal: Negative for back pain. As above.  Skin: Negative for rash.   ____________________________________________   PHYSICAL EXAM:  VITAL SIGNS: ED Triage Vitals [04/07/18 1257]  Enc Vitals Group     BP 118/75     Pulse Rate 100 Recheck 86     Resp 16     Temp 98.4 F (36.9 C)     Temp Source Oral     SpO2 100 %     Weight 204 lb (92.5 kg)     Height 5\' 8"  (1.727 m)     Head Circumference      Peak Flow      Pain Score 7     Pain Loc      Pain Edu?      Excl. in GC?     Constitutional: Alert and oriented. Well appearing and in no acute distress. ENT      Head: Normocephalic and atraumatic. Cardiovascular: Normal rate, regular rhythm.  Grossly normal heart sounds.  Good peripheral circulation. Respiratory: Normal respiratory effort without tachypnea nor retractions. Breath sounds are clear and equal bilaterally. No wheezes, rales, rhonchi. Gastrointestinal: Soft and nontender. No distention. Normal Bowel sounds. No CVA tenderness. Musculoskeletal:  Steady gait. Bilateral dorsalis pedis and posterior tibialis pulses equal and easily palpated. Except: Dorsal mid left foot minimal edema localized noted with mild tenderness over the navicular process, left foot otherwise nontender, normal distal sensation and capillary refill, no erythema, skin intact, no left lower extremity edema otherwise, negative Homans sign, full  range of motion to left lower extremity.  Steady gait. Neurologic:  Normal speech and language. Speech is normal. No gait instability.  Skin:  Skin is warm, dry and intact. No rash noted. Psychiatric: Mood and affect are normal. Speech and behavior are normal. Patient exhibits appropriate insight and judgment   Well's criteria for DVT=0. ___________________________________________   LABS (all labs ordered are listed, but only abnormal results are displayed)  Labs Reviewed - No data to display  RADIOLOGY  Dg Foot Complete Left  Result Date: 04/07/2018 CLINICAL DATA:  Dorsal midfoot pain EXAM: LEFT FOOT - COMPLETE 3+ VIEW COMPARISON:  None. FINDINGS: There is no evidence of fracture or dislocation. There is no evidence of arthropathy or other focal bone abnormality. Soft tissues are unremarkable. IMPRESSION: Negative. Electronically Signed   By: Deatra Robinson M.D.   On: 04/07/2018 14:32   ____________________________________________   PROCEDURES Procedures    INITIAL IMPRESSION / ASSESSMENT AND PLAN / ED COURSE  Pertinent labs & imaging results that were available during my care of the patient were reviewed by me and considered in my medical decision making (see chart for details).  Well-appearing patient.  No acute distress.  Left foot pain for the last 2 months.  Suspect related to still toed boots rubbing to this area causing local inflammation.  Left foot x-ray as above, reviewed and per radiologist negative.  Discussed evaluation for ultrasound, patient declined at this time.  Discussed will treat with Mobic.,  Supportive care, ice, good supportive shoes.  Patient also states when she is at work and she can sit on a stool some it helps not rub on her foot and this helps, note given to support this.  Discussed follow-up with podiatry in 1 week if symptoms are continuing.  Patient agrees this plan.  Discussed follow up with Primary care physician this week. Discussed follow up and  return parameters including no resolution or any worsening concerns. Patient verbalized understanding and agreed to plan.   ____________________________________________   FINAL CLINICAL IMPRESSION(S) / ED DIAGNOSES  Final diagnoses:  Left foot pain     ED Discharge Orders         Ordered    meloxicam (MOBIC) 15 MG tablet  Daily PRN     04/07/18 1448           Note: This dictation was prepared with Dragon dictation along with smaller phrase technology. Any transcriptional errors that result from this process are unintentional.         Renford Dills, NP 04/07/18 1502

## 2018-04-07 NOTE — ED Triage Notes (Signed)
Patient in today c/o "knot" on the top of her left foot x 2 months. Patient states it is now making her toes numb. Patient states she wears steel toed boots for work and thinks this is aggravating foot.

## 2019-02-05 ENCOUNTER — Emergency Department
Admission: EM | Admit: 2019-02-05 | Discharge: 2019-02-05 | Disposition: A | Discharge status: Home / Self Care | Payer: Managed Care, Other (non HMO) | Attending: Emergency Medicine | Admitting: Emergency Medicine

## 2019-02-05 ENCOUNTER — Encounter: Payer: Self-pay | Admitting: Emergency Medicine

## 2019-02-05 ENCOUNTER — Other Ambulatory Visit: Payer: Self-pay

## 2019-02-05 DIAGNOSIS — Z87891 Personal history of nicotine dependence: Secondary | ICD-10-CM | POA: Insufficient documentation

## 2019-02-05 DIAGNOSIS — K219 Gastro-esophageal reflux disease without esophagitis: Secondary | ICD-10-CM | POA: Insufficient documentation

## 2019-02-05 LAB — CBC WITH DIFFERENTIAL/PLATELET
Abs Immature Granulocytes: 0.04 10*3/uL (ref 0.00–0.07)
Basophils Absolute: 0 10*3/uL (ref 0.0–0.1)
Basophils Relative: 0 %
Eosinophils Absolute: 0.3 10*3/uL (ref 0.0–0.5)
Eosinophils Relative: 2 %
HCT: 40 % (ref 36.0–46.0)
Hemoglobin: 13.4 g/dL (ref 12.0–15.0)
Immature Granulocytes: 0 %
Lymphocytes Relative: 27 %
Lymphs Abs: 3 10*3/uL (ref 0.7–4.0)
MCH: 30.8 pg (ref 26.0–34.0)
MCHC: 33.5 g/dL (ref 30.0–36.0)
MCV: 92 fL (ref 80.0–100.0)
Monocytes Absolute: 0.8 10*3/uL (ref 0.1–1.0)
Monocytes Relative: 7 %
Neutro Abs: 7.1 10*3/uL (ref 1.7–7.7)
Neutrophils Relative %: 64 %
Platelets: 245 10*3/uL (ref 150–400)
RBC: 4.35 MIL/uL (ref 3.87–5.11)
RDW: 13.3 % (ref 11.5–15.5)
WBC: 11.2 10*3/uL — ABNORMAL HIGH (ref 4.0–10.5)
nRBC: 0 % (ref 0.0–0.2)

## 2019-02-05 LAB — COMPREHENSIVE METABOLIC PANEL
ALT: 12 U/L (ref 0–44)
AST: 13 U/L — ABNORMAL LOW (ref 15–41)
Albumin: 3.8 g/dL (ref 3.5–5.0)
Alkaline Phosphatase: 72 U/L (ref 38–126)
Anion gap: 9 (ref 5–15)
BUN: 12 mg/dL (ref 6–20)
CO2: 25 mmol/L (ref 22–32)
Calcium: 9.3 mg/dL (ref 8.9–10.3)
Chloride: 104 mmol/L (ref 98–111)
Creatinine, Ser: 1.02 mg/dL — ABNORMAL HIGH (ref 0.44–1.00)
GFR calc Af Amer: >60 mL/min (ref >60)
GFR calc non Af Amer: >60 mL/min (ref >60)
Glucose, Bld: 105 mg/dL — ABNORMAL HIGH (ref 70–99)
Potassium: 4.1 mmol/L (ref 3.5–5.1)
Sodium: 138 mmol/L (ref 135–145)
Total Bilirubin: 0.4 mg/dL (ref 0.3–1.2)
Total Protein: 6.9 g/dL (ref 6.5–8.1)

## 2019-02-05 LAB — LIPASE, BLOOD: Lipase: 24 U/L (ref 11–51)

## 2019-02-05 MED ORDER — POLYETHYLENE GLYCOL 3350 17 G PO PACK: 17.0000 g | PACK | Freq: Every day | ORAL | 0 refills | Status: DC

## 2019-02-05 MED ORDER — OMEPRAZOLE 40 MG PO CPDR: 40.0000 mg | DELAYED_RELEASE_CAPSULE | Freq: Every day | ORAL | 1 refills | Status: DC

## 2019-02-05 MED ORDER — SUCRALFATE 1 G PO TABS: 1.0000 g | ORAL_TABLET | Freq: Four times a day (QID) | ORAL | 1 refills | Status: DC

## 2019-02-05 MED ORDER — LIDOCAINE VISCOUS HCL 2 % MT SOLN
15.0000 mL | Freq: Once | OROMUCOSAL | Status: AC
  Administered 2019-02-05: 15 mL via ORAL
  Filled 2019-02-05: qty 15

## 2019-02-05 MED ORDER — ALUM & MAG HYDROXIDE-SIMETH 200-200-20 MG/5ML PO SUSP
30.0000 mL | Freq: Once | ORAL | Status: AC
  Administered 2019-02-05: 08:00:00 30 mL via ORAL
  Filled 2019-02-05: qty 30

## 2019-02-05 MED ORDER — FAMOTIDINE 20 MG PO TABS
20.0000 mg | ORAL_TABLET | Freq: Once | ORAL | Status: AC
  Administered 2019-02-05: 20 mg via ORAL
  Filled 2019-02-05: qty 1

## 2019-02-05 NOTE — ED Triage Notes (Addendum)
Patient ambulatory to triage with steady gait, without difficulty or distress noted, mask in place; pt reports lower abd pain since Friday; denies accomp symptoms or hx of same; st has been taking 800mg  ibuprofen recently for a chipped tooth and also that her pain began after eating some "funny tasting" food from [employer], "maybe I got a tapeworm, I don't know"

## 2019-02-05 NOTE — ED Provider Notes (Signed)
[facility] Emergency Department Provider Note       Time seen: ----------------------------------------- 7:13 AM on 02/05/2019 -----------------------------------------   I have reviewed the triage vital signs and the nursing notes.  HISTORY   Chief Complaint No chief complaint on file.    HPI Autumn Cross is a 38 y.o. female with no significant past medical history who presents to the ED for upper abdominal pain since Friday.  This differs from the triage note.  Patient describes a bandlike pain across her upper abdomen.  She been taking ibuprofen and naproxen recently for a chipped tooth.  Has been having some heartburn.  She is concerned she may have eaten something bad from [employer].  She denies any vomiting or diarrhea.  Past Medical History:  Diagnosis Date  . Herpes     There are no active problems to display for this patient.   Past Surgical History:  Procedure Laterality Date  . NO PAST SURGERIES      Allergies Naproxen and Percocet [oxycodone-acetaminophen]  Social History Social History   Tobacco Use  . Smoking status: Former Smoker    Types: Cigars    Quit date: 04/07/2017    Years since quitting: 1.8  . Smokeless tobacco: Never Used  Substance Use Topics  . Alcohol use: Yes    Comment: rare  . Drug use: No   Review of Systems Constitutional: Negative for fever. Cardiovascular: Negative for chest pain. Respiratory: Negative for shortness of breath. Gastrointestinal: Positive for abdominal pain Musculoskeletal: Negative for back pain. Skin: Negative for rash. Neurological: Negative for headaches, focal weakness or numbness.  All systems negative/normal/unremarkable except as stated in the HPI  ____________________________________________   PHYSICAL EXAM:  VITAL SIGNS: ED Triage Vitals [02/05/19 0114]  Enc Vitals Group     BP (!) 128/56     Pulse Rate 76     Resp 20     Temp 98.5 F (36.9 C)     Temp  Source Oral     SpO2 100 %     Weight 209 lb (94.8 kg)     Height 5\' 3"  (1.6 m)     Head Circumference      Peak Flow      Pain Score 10     Pain Loc      Pain Edu?      Excl. in GC?    Constitutional: Alert and oriented. Well appearing and in no distress. Eyes: Conjunctivae are normal. Normal extraocular movements. Cardiovascular: Normal rate, regular rhythm. No murmurs, rubs, or gallops. Respiratory: Normal respiratory effort without tachypnea nor retractions. Breath sounds are clear and equal bilaterally. No wheezes/rales/rhonchi. Gastrointestinal: No focal abdominal tenderness, no rebound or guarding.  Normal bowel sounds. Musculoskeletal: Nontender with normal range of motion in extremities. No lower extremity tenderness nor edema. Neurologic:  Normal speech and language. No gross focal neurologic deficits are appreciated.  Skin:  Skin is warm, dry and intact. No rash noted. Psychiatric: Mood and affect are normal. Speech and behavior are normal.  ____________________________________________  ED COURSE:  As part of my medical decision making, I reviewed the following data within the electronic MEDICAL RECORD NUMBER History obtained from family if available, nursing notes, old chart and ekg, as well as notes from prior ED visits. Patient presented for abdominal pain, we will assess with labs and imaging as indicated at this time.   Procedures  [name] was evaluated in Emergency Department on 02/05/2019 for the symptoms described in  the history of present illness. She was evaluated in the context of the global COVID-19 pandemic, which necessitated consideration that the patient might be at risk for infection with the SARS-CoV-2 virus that causes COVID-19. Institutional protocols and algorithms that pertain to the evaluation of patients at risk for COVID-19 are in a state of rapid change based on information released by regulatory bodies including the CDC and federal and state  organizations. These policies and algorithms were followed during the patient's care in the ED.  ____________________________________________   LABS (pertinent positives/negatives)  Labs Reviewed  CBC WITH DIFFERENTIAL/PLATELET - Abnormal; Notable for the following components:      Result Value   WBC 11.2 (*)    All other components within normal limits  COMPREHENSIVE METABOLIC PANEL - Abnormal; Notable for the following components:   Glucose, Bld 105 (*)    Creatinine, Ser 1.02 (*)    AST 13 (*)    All other components within normal limits  LIPASE, BLOOD  URINALYSIS, COMPLETE (UACMP) WITH MICROSCOPIC  ____________________________________________   DIFFERENTIAL DIAGNOSIS   Gastritis, peptic ulcer disease, GERD, gastroenteritis  FINAL ASSESSMENT AND PLAN  Gastritis   Plan: The patient had presented for upper abdominal pain. Patient's labs were reassuring.  This seems to be gastritis related or could be NSAID related.  Overall she looks well, she was given a GI cocktail as well as Pepcid here.  She will be discharged with omeprazole and MiraLAX as she is also constipated.  She is cleared for outpatient follow-up.   Laurence Aly, MD    Note: This note was generated in part or whole with voice recognition software. Voice recognition is usually quite accurate but there are transcription errors that can and very often do occur. I apologize for any typographical errors that were not detected and corrected.     Earleen Newport, MD 02/05/19 [phone]

## 2019-03-12 ENCOUNTER — Ambulatory Visit: Payer: Self-pay | Admitting: Gastroenterology

## 2019-03-12 ENCOUNTER — Encounter: Payer: Self-pay | Admitting: *Deleted

## 2019-03-12 ENCOUNTER — Encounter

## 2019-03-12 NOTE — Progress Notes (Deleted)
Gastroenterology Consultation  Referring Provider:     No ref. provider found Primary Care Physician:  Patient, No Pcp Per Primary Gastroenterologist:  Dr. Allen Norris     Reason for Consultation:    Abdominal pain        HPI:   Autumn Cross is a 38 y.o. y/o female referred for consultation & management of abdominal pain by Dr. Patient, No Pcp Per.  This patient comes in today after being seen in the emergency room for abdominal pain.  The patient states that she has been taking NSAIDs for a chipped tooth and was having significant upper abdominal pain.  The patient attributed to something she thought she had eaten that cookouts.  The patient was sent home with omeprazole and MiraLAX because she was also reporting some constipation.  Past Medical History:  Diagnosis Date  . Herpes     Past Surgical History:  Procedure Laterality Date  . NO PAST SURGERIES      Prior to Admission medications   Medication Sig Start Date End Date Taking? Authorizing Provider  meloxicam (MOBIC) 15 MG tablet Take 1 tablet (15 mg total) by mouth daily as needed. 04/07/18   Marylene Land, NP  omeprazole (PRILOSEC) 40 MG capsule Take 1 capsule (40 mg total) by mouth daily. 02/05/19 02/05/20  Earleen Newport, MD  polyethylene glycol (MIRALAX / GLYCOLAX) 17 g packet Take 17 g by mouth daily. 02/05/19   Earleen Newport, MD  sucralfate (CARAFATE) 1 g tablet Take 1 tablet (1 g total) by mouth 4 (four) times daily. 02/05/19 02/05/20  Earleen Newport, MD    Family History  Problem Relation Age of Onset  . Healthy Mother   . Healthy Father      Social History   Tobacco Use  . Smoking status: Former Smoker    Types: Cigars    Quit date: 04/07/2017    Years since quitting: 1.9  . Smokeless tobacco: Never Used  Substance Use Topics  . Alcohol use: Yes    Comment: rare  . Drug use: No    Allergies as of 03/12/2019 - Review Complete 02/05/2019  Allergen Reaction Noted  . Naproxen Nausea  And Vomiting 09/29/2014  . Percocet [oxycodone-acetaminophen]  09/29/2014    Review of Systems:    All systems reviewed and negative except where noted in HPI.   Physical Exam:  There were no vitals taken for this visit. No LMP recorded. General:   Alert,  Well-developed, well-nourished, pleasant and cooperative in NAD Head:  Normocephalic and atraumatic. Eyes:  Sclera clear, no icterus.   Conjunctiva pink. Ears:  Normal auditory acuity. Nose:  No deformity, discharge, or lesions. Mouth:  No deformity or lesions,oropharynx pink & moist. Neck:  Supple; no masses or thyromegaly. Lungs:  Respirations even and unlabored.  Clear throughout to auscultation.   No wheezes, crackles, or rhonchi. No acute distress. Heart:  Regular rate and rhythm; no murmurs, clicks, rubs, or gallops. Abdomen:  Normal bowel sounds.  No bruits.  Soft, non-tender and non-distended without masses, hepatosplenomegaly or hernias noted.  No guarding or rebound tenderness.  Negative Carnett sign.   Rectal:  Deferred.  Msk:  Symmetrical without gross deformities.  Good, equal movement & strength bilaterally. Pulses:  Normal pulses noted. Extremities:  No clubbing or edema.  No cyanosis. Neurologic:  Alert and oriented x3;  grossly normal neurologically. Skin:  Intact without significant lesions or rashes.  No jaundice. Lymph Nodes:  No significant cervical adenopathy.  Psych:  Alert and cooperative. Normal mood and affect.  Imaging Studies: No results found.  Assessment and Plan:   Autumn Cross is a 38 y.o. y/o female ***  This visit consisted of *** minutes face to face contact with myself and at least 50% of this time was spent in counseling and education regarding diagnosis, treatment options, medication management, risks and benefits of treatment.  Midge Minium, MD. Clementeen Graham    Note: This dictation was prepared with Dragon dictation along with smaller phrase technology. Any transcriptional errors that  result from this process are unintentional.

## 2019-06-20 ENCOUNTER — Ambulatory Visit
Admission: EM | Admit: 2019-06-20 | Discharge: 2019-06-20 | Disposition: A | Discharge status: Home / Self Care | Payer: Self-pay | Attending: Family Medicine | Admitting: Family Medicine

## 2019-06-20 ENCOUNTER — Encounter: Payer: Self-pay | Admitting: Emergency Medicine

## 2019-06-20 ENCOUNTER — Other Ambulatory Visit: Payer: Self-pay

## 2019-06-20 DIAGNOSIS — L0291 Cutaneous abscess, unspecified: Secondary | ICD-10-CM

## 2019-06-20 MED ORDER — DOXYCYCLINE HYCLATE 100 MG PO CAPS: 100.0000 mg | ORAL_CAPSULE | Freq: Two times a day (BID) | ORAL | 0 refills | Status: DC

## 2019-06-20 MED ORDER — TRAMADOL HCL 50 MG PO TABS: 50.0000 mg | ORAL_TABLET | Freq: Three times a day (TID) | ORAL | 0 refills | Status: DC | PRN

## 2019-06-20 NOTE — ED Triage Notes (Signed)
Patient c/o abscess on the inside of her left inner thigh since yesterday.  Patient reports drainage from the site.  Patient denies fevers.

## 2019-06-20 NOTE — Discharge Instructions (Signed)
Medication as prescribed.  Warm compresses/warm soaks.  Take care  Dr. Adriana Simas

## 2019-06-20 NOTE — ED Provider Notes (Signed)
[facility]    CSN: [id number] Arrival date & time: 06/20/19  0949      History   Chief Complaint Chief Complaint  Patient presents with  . Abscess   HPI  39 year old Cross presents with an abscess.  Patient reports an ongoing abscess to the left inner thigh for the past week.  Patient reports that she "busted" the abscess today.  It has drained blood and pus.  Seems to be improved following this.  Patient continues to have pain, Autumn/10 in severity.  Denies fever.  Patient states that she has not felt well however.  Exacerbated by pressure.  No other reported symptoms.  No other complaints.  No significant PMH.  Past Surgical History:  Procedure Laterality Date  . NO PAST SURGERIES      OB History   No obstetric history on file.      Home Medications    Prior to Admission medications   Medication Sig Start Date End Date Taking? Authorizing Provider  doxycycline (VIBRAMYCIN) 100 MG capsule Take 1 capsule (100 mg total) by mouth 2 (two) times daily. 06/20/19   Coral Spikes, DO  traMADol (ULTRAM) 50 MG tablet Take 1 tablet (50 mg total) by mouth every 8 (eight) hours as needed. 06/20/19   Coral Spikes, DO  omeprazole (PRILOSEC) 40 MG capsule Take 1 capsule (40 mg total) by mouth daily. Autumn/10/20 06/20/19  Earleen Newport, MD  sucralfate (CARAFATE) 1 g tablet Take 1 tablet (1 g total) by mouth 4 (four) times daily. Autumn/10/20 06/20/19  Earleen Newport, MD    Family History Family History  Problem Relation Age of Onset  . Healthy Mother   . Healthy Father     Social History Social History   Tobacco Use  . Smoking status: Former Smoker    Types: Cigars    Quit date: 04/07/2017    Years since quitting: 2.2  . Smokeless tobacco: Never Used  Substance Use Topics  . Alcohol use: Yes    Comment: rare  . Drug use: No     Allergies   Naproxen and Percocet [oxycodone-acetaminophen]   Review of Systems Review of Systems  Constitutional: Negative  for fever.  Skin:       Abscess.   Physical Exam Triage Vital Signs ED Triage Vitals  Enc Vitals Group     BP 06/20/19 1000 109/90     Pulse Rate 06/20/19 1000 (!) 112     Resp 06/20/19 1000 14     Temp 06/20/19 1000 98.Autumn F (37.2 C)     Temp Source 06/20/19 1000 Oral     SpO2 06/20/19 1000 99 %     Weight 06/20/19 0958 209 lb (94.8 kg)     Height 06/20/19 0958 5\' 4"  (1.626 m)     Head Circumference --      Peak Flow --      Pain Score 06/20/19 0958 Autumn     Pain Loc --      Pain Edu? --      Excl. in [place]? --    Updated Vital Signs BP 109/90 (BP Location: Left Arm)   Pulse (!) 112   Temp 98.Autumn F (37.2 C) (Oral)   Resp 14   Ht 5\' 4"  (1.626 m)   Wt 94.8 kg   LMP 06/06/2019 (Approximate)   SpO2 99%   BMI 35.87 kg/m   Visual Acuity Right Eye Distance:   Left Eye Distance:   Bilateral Distance:  Right Eye Near:   Left Eye Near:    Bilateral Near:     Physical Exam Constitutional:      General: She is not in acute distress.    Appearance: Normal appearance. She is not ill-appearing.  HENT:     Head: Normocephalic and atraumatic.  Eyes:     General:        Right eye: No discharge.        Left eye: No discharge.     Conjunctiva/sclera: Conjunctivae normal.  Cardiovascular:     Rate and Rhythm: Normal rate and regular rhythm.  Pulmonary:     Effort: Pulmonary effort is normal. No respiratory distress.  Skin:    Comments: Left upper medial thigh with a small area of induration.  Evidence of drainage noted.  No current fluctuance.  Neurological:     Mental Status: She is alert.  Psychiatric:        Mood and Affect: Mood normal.        Behavior: Behavior normal.    UC Treatments / Results  Labs (all labs ordered are listed, but only abnormal results are displayed) Labs Reviewed - No data to display  EKG   Radiology No results found.  Procedures Procedures (including critical care time)  Medications Ordered in UC Medications - No data to  display  Initial Impression / Assessment and Plan / UC Course  I have reviewed the triage vital signs and the nursing notes.  Pertinent labs & imaging results that were available during my care of the patient were reviewed by me and considered in my medical decision making (see chart for details).    39 year old Cross presents with an abscess.  No indications for incision and drainage as it is already draining.  Treating pain with tramadol.  Doxycycline as directed.  Supportive care.    Final Clinical Impressions(s) / UC Diagnoses   Final diagnoses:  Abscess     Discharge Instructions     Medication as prescribed.  Warm compresses/warm soaks.  Take care  Dr. Adriana Simas    ED Prescriptions    Medication Sig Dispense Auth. Provider   doxycycline (VIBRAMYCIN) 100 MG capsule Take 1 capsule (100 mg total) by mouth 2 (two) times daily. 14 capsule Racquelle Hyser G, DO   traMADol (ULTRAM) 50 MG tablet Take 1 tablet (50 mg total) by mouth every 8 (eight) hours as needed. 10 tablet Everlene Other G, DO     I have reviewed the PDMP during this encounter.   Tommie Sams, [place] 06/20/19 1023

## 2019-09-08 ENCOUNTER — Ambulatory Visit
Admission: EM | Admit: 2019-09-08 | Discharge: 2019-09-08 | Disposition: A | Discharge status: Home / Self Care | Payer: BC Managed Care – PPO

## 2019-09-08 ENCOUNTER — Encounter: Payer: Self-pay | Admitting: Emergency Medicine

## 2019-09-08 ENCOUNTER — Other Ambulatory Visit: Payer: Self-pay

## 2019-09-08 DIAGNOSIS — M7752 Other enthesopathy of left foot: Secondary | ICD-10-CM | POA: Diagnosis not present

## 2019-09-08 MED ORDER — PREDNISONE 20 MG PO TABS: 40.0000 mg | ORAL_TABLET | Freq: Every day | ORAL | 0 refills | Status: DC

## 2019-09-08 NOTE — Discharge Instructions (Addendum)
It was very nice seeing you today in clinic. Thank you for entrusting me with your care.   Rest, ice, and elevate foot. Use steroids as prescribed.  Make arrangements to follow up with podiatry in 1 week for re-evaluation if not improving. I have provided you the name and office contact information for an excellent local provider. If your symptoms/condition worsens, please seek follow up care either here or in the ER. Please remember, our [facility] providers are "right here with you" when you need [place].   Again, it was my pleasure to take care of you today. Thank you for choosing our clinic. I hope that you start to feel better quickly.   Quentin Mulling, MSN, APRN, FNP-C, CEN Advanced Practice Provider [facility]

## 2019-09-08 NOTE — ED Triage Notes (Signed)
Pt c/o left foot pain. She states there is a "knot" on top of her foot. She was seen for this back in 03/2018. 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 She was not able to see podiatry as recommended.

## 2019-09-09 NOTE — ED Provider Notes (Signed)
[place], [place]   Name: Autumn Cross DOB: 1980-06-15 MRN: [id number] CSN: [id number] PCP: Patient, No Pcp Per  Arrival date and time:  09/08/19 1421  Chief Complaint:  Foot Pain (left)  NOTE: Prior to seeing the patient today, I have reviewed the triage nursing documentation and vital signs. Clinical staff has updated patient's PMH/PSHx, current medication list, and drug allergies/intolerances to ensure comprehensive history available to assist in medical decision making.   History:   HPI: Autumn Cross is a 39 y.o. female who presents today with complaints of pain in her LEFT foot that has been bothering her since the end of last week. Patient reporting that there is a "knot" on top of her foot that is causing her pain. Of note, patient was seen here back in 03/2018 for the same. She was referred to podiatry, however she never was seen by the foot specialist citing that she could not afford it. Patient notes that this is the same pain. She reports that she has been experiencing intermittent issues sine 2019, however has historically been allowed to sit on a stool at work. 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 She is having to do much more walking that she normally does, which in turn has caused her foot condition to flare. Despite her symptoms, patient has not taken any over the counter interventions to help improve/relieve her reported symptoms at home. .  Past Medical History:  Diagnosis Date  . Herpes     Past Surgical History:  Procedure Laterality Date  . NO PAST SURGERIES      Family History  Problem Relation Age of Onset  . Healthy Mother   . Healthy Father     Social History   Tobacco Use  . Smoking status: Former Smoker    Types: Cigars    Quit date: 04/07/2017    Years since quitting: 2.4  . Smokeless tobacco: Never Used  Substance Use Topics  . Alcohol use: Yes    Comment: rare  . Drug  use: No    There are no problems to display for this patient.   Home Medications:    Current Meds  Medication Sig  . acyclovir (ZOVIRAX) 200 MG capsule Take 400 mg by mouth 2 (two) times daily.    Allergies:   Naproxen and Percocet [oxycodone-acetaminophen]  Review of Systems (ROS):  Review of systems NEGATIVE unless otherwise noted in narrative H&P section.   Vital Signs: Today's Vitals   09/08/19 1454 09/08/19 1458 09/08/19 1540  BP:  116/76   Pulse:  87   Resp:  18   Temp:  98.2 F (36.8 C)   TempSrc:  Oral   SpO2:  100%   Weight: 208 lb (94.3 kg)    Height: 5\' 4"  (1.626 m)    PainSc: 8   8     Physical Exam: Physical Exam  Constitutional: She is oriented to person, place, and time and well-developed, well-nourished, and in no distress.  HENT:  Head: Normocephalic and atraumatic.  Eyes: Pupils are equal, round, and reactive to light.  Cardiovascular: Normal rate, regular rhythm, normal heart sounds and intact distal pulses.  Pulmonary/Chest: Effort normal and breath sounds normal.  Musculoskeletal:     Left foot: Normal range of motion. Swelling and tenderness present. No deformity or crepitus.       Feet:     Comments: Area of palpable induration (soft/fluid) to  marked location. FROM with normal temperature, color, and capillary refill.   Neurological: She is alert and oriented to person, place, and time. She has normal sensation, normal strength and normal reflexes. Gait normal.  Skin: Skin is warm and dry. No rash noted. She is not diaphoretic.  Psychiatric: Mood, memory, affect and judgment normal.  Nursing note and vitals reviewed.   Urgent Care Treatments / Results:   No orders of the defined types were placed in this encounter.   LABS: PLEASE NOTE: all labs that were ordered this encounter are listed, however only abnormal results are displayed. Labs Reviewed - No data to display  EKG: -None  RADIOLOGY: No results  found.  PROCEDURES: Procedures  MEDICATIONS RECEIVED THIS VISIT: Medications - No data to display  PERTINENT CLINICAL COURSE NOTES/UPDATES:   Initial Impression / Assessment and Plan / Urgent Care Course:  Pertinent labs & imaging results that were available during my care of the patient were personally reviewed by me and considered in my medical decision making (see lab/imaging section of note for values and interpretations).  Autumn Cross is a 39 y.o. female who presents to [facility] today with complaints of Foot Pain (left)  Patient is well appearing overall in clinic today. She does not appear to be in any acute distress. Presenting symptoms (see HPI) and exam as documented above. Symptoms present to varying degrees since 2019. She has had previous imaging. Patient reports that this is the same pain and wishes to defer repeat imaging today. Given the location of her pain, coupled with her exam, patient has tendonitis in her ankle. Recommended rest, ice, and elevation. Discussed stretches at home to help improve her pain. Will treat acute flare with systemic steroids (prednisone). Patient may supplement with APAP. She was encouraged to avoid NSAIDs while on steroids, as it could cause elevations in her blood pressure. Patient advised that ultimately, she needs to see podiatry for definitive management of her condition. Name and office contact information provided on today's AVS for Dr. Gwyneth Revels. Patient advised the she will need to contact the office to schedule an appointment to be seen.   Current clinical condition warrants patient being out of work in order to recover from her current injury/illness. She was provided with the appropriate documentation to provide to her place of employment that will allow for her to RTW on 09/10/2019 with no restrictions.   I have reviewed the follow up and strict return precautions for any new or worsening symptoms. Patient is aware of  symptoms that would be deemed urgent/emergent, and would thus require further evaluation either here or in the emergency department. At the time of discharge, she verbalized understanding and consent with the discharge plan as it was reviewed with her. All questions were fielded by provider and/or clinic staff prior to patient discharge.    Final Clinical Impressions / Urgent Care Diagnoses:   Final diagnoses:  Tendonitis of ankle, left    New Prescriptions:  [place] Controlled Substance Registry consulted? Not Applicable  Meds ordered this encounter  Medications  . predniSONE (DELTASONE) 20 MG tablet    Sig: Take 2 tablets (40 mg total) by mouth daily.    Dispense:  10 tablet    Refill:  0    Recommended Follow up Care:  Patient encouraged to follow up with the following provider within the specified time frame, or sooner as dictated by the severity of her symptoms. As always, she was instructed that for any  urgent/emergent care needs, she should seek care either here or in the emergency department for more immediate evaluation.  Follow-up Information    Gwyneth Revels, DPM In 1 week.   Specialty: Podiatry Why: General reassessment of symptoms if not improving Contact information: [address] [facility] [place] [address] [phone]         NOTE: This note was prepared using Dragon dictation software along with smaller phrase technology. Despite my best ability to proofread, there is the potential that transcriptional errors may still occur from this process, and are completely unintentional.    Verlee Monte, NP 09/09/19 2334

## 2020-08-02 ENCOUNTER — Ambulatory Visit (INDEPENDENT_AMBULATORY_CARE_PROVIDER_SITE_OTHER): Payer: BC Managed Care – PPO

## 2020-08-02 ENCOUNTER — Ambulatory Visit
Admission: EM | Admit: 2020-08-02 | Discharge: 2020-08-02 | Disposition: A | Discharge status: Home / Self Care | Payer: BC Managed Care – PPO | Attending: Emergency Medicine | Admitting: Emergency Medicine

## 2020-08-02 ENCOUNTER — Other Ambulatory Visit: Payer: Self-pay

## 2020-08-02 DIAGNOSIS — M1990 Unspecified osteoarthritis, unspecified site: Secondary | ICD-10-CM

## 2020-08-02 DIAGNOSIS — M25561 Pain in right knee: Secondary | ICD-10-CM | POA: Diagnosis not present

## 2020-08-02 MED ORDER — MELOXICAM 7.5 MG PO TABS: 7.5000 mg | ORAL_TABLET | Freq: Every day | ORAL | 1 refills | Status: DC

## 2020-08-02 MED ORDER — NYSTATIN 100000 UNIT/GM EX CREA: TOPICAL_CREAM | CUTANEOUS | 0 refills | Status: DC

## 2020-08-02 NOTE — ED Provider Notes (Signed)
[facility]    CSN: [id number] Arrival date & time: 08/02/20  1643      History   Chief Complaint Chief Complaint  Patient presents with  . Knee Pain    right  . Rash    HPI Autumn Cross is a 40 y.o. female.   HPI   40 year old female here for evaluation of pain in her right knee and a rash.  Patient reports that she has been experiencing pain in her right knee for the past week.  She states that when she walks she feels like is going to give out.  The pain is worse when she walks up and down steps and she will occasionally feel popping and clicking.  She is not aware of any current or past injury.  Patient has no numbness or tingling.  She does wear a copper fit sleeve for support and to control swelling.  Additionally, patient is here for evaluation of a rash on the back of her neck that has been there for 2 weeks and on the outside of her left lower leg that has been there for a month.  She states that it itches and it was scaly initially.  Patient reports that the rash does not burn and she does not have any history of diabetes.  Past Medical History:  Diagnosis Date  . Herpes     There are no problems to display for this patient.   Past Surgical History:  Procedure Laterality Date  . NO PAST SURGERIES      OB History   No obstetric history on file.      Home Medications    Prior to Admission medications   Medication Sig Start Date End Date Taking? Authorizing Provider  meloxicam (MOBIC) 7.5 MG tablet Take 1 tablet (7.5 mg total) by mouth daily. 08/02/20  Yes Becky Augusta, NP  nystatin cream (MYCOSTATIN) Apply to affected area 2 times daily 08/02/20  Yes Becky Augusta, NP  omeprazole (PRILOSEC) 40 MG capsule Take 1 capsule (40 mg total) by mouth daily. 02/05/19 06/20/19  Emily Filbert, MD  sucralfate (CARAFATE) 1 g tablet Take 1 tablet (1 g total) by mouth 4 (four) times daily. 02/05/19 06/20/19  Emily Filbert, MD    Family  History Family History  Problem Relation Age of Onset  . Healthy Mother   . Healthy Father     Social History Social History   Tobacco Use  . Smoking status: Former Smoker    Types: Cigars    Quit date: 04/07/2017    Years since quitting: 3.3  . Smokeless tobacco: Never Used  Vaping Use  . Vaping Use: Never used  Substance Use Topics  . Alcohol use: Yes    Comment: rare  . Drug use: No     Allergies   Naproxen and Percocet [oxycodone-acetaminophen]   Review of Systems Review of Systems  Constitutional: Negative for fever.  Musculoskeletal: Positive for arthralgias and joint swelling.  Skin: Positive for rash.  Neurological: Negative for tremors, weakness and numbness.  Hematological: Negative.   Psychiatric/Behavioral: Negative.      Physical Exam Triage Vital Signs ED Triage Vitals  Enc Vitals Group     BP 08/02/20 1656 111/77     Pulse Rate 08/02/20 1656 83     Resp 08/02/20 1656 18     Temp 08/02/20 1656 98.9 F (37.2 C)     Temp Source 08/02/20 1656 Oral     SpO2 08/02/20 1656  100 %     Weight 08/02/20 1654 210 lb (95.3 kg)     Height 08/02/20 1654 5\' 3"  (1.6 m)     Head Circumference --      Peak Flow --      Pain Score 08/02/20 1653 8     Pain Loc --      Pain Edu? --      Excl. in GC? --    No data found.  Updated Vital Signs BP 111/77 (BP Location: Left Arm)   Pulse 83   Temp 98.9 F (37.2 C) (Oral)   Resp 18   Ht 5\' 3"  (1.6 m)   Wt 210 lb (95.3 kg)   LMP 07/24/2020   SpO2 100%   BMI 37.20 kg/m   Visual Acuity Right Eye Distance:   Left Eye Distance:   Bilateral Distance:    Right Eye Near:   Left Eye Near:    Bilateral Near:     Physical Exam Vitals and nursing note reviewed.  Constitutional:      General: She is not in acute distress.    Appearance: Normal appearance. She is normal weight. She is not ill-appearing.  HENT:     Head: Normocephalic and atraumatic.  Cardiovascular:     Rate and Rhythm: Normal rate and  regular rhythm.     Pulses: Normal pulses.     Heart sounds: Normal heart sounds. No murmur heard. No gallop.   Pulmonary:     Effort: Pulmonary effort is normal.     Breath sounds: Normal breath sounds. No wheezing, rhonchi or rales.  Musculoskeletal:        General: Swelling and tenderness present. No deformity or signs of injury.  Skin:    General: Skin is warm and dry.     Capillary Refill: Capillary refill takes less than 2 seconds.     Findings: Rash present.  Neurological:     General: No focal deficit present.     Mental Status: She is alert and oriented to person, place, and time.  Psychiatric:        Mood and Affect: Mood normal.        Behavior: Behavior normal.        Thought Content: Thought content normal.        Judgment: Judgment normal.      UC Treatments / Results  Labs (all labs ordered are listed, but only abnormal results are displayed) Labs Reviewed - No data to display  EKG   Radiology DG Knee Complete 4 Views Right  Result Date: 08/02/2020 CLINICAL DATA:  Right knee pain, no known injury, initial encounter EXAM: RIGHT KNEE - COMPLETE 4+ VIEW COMPARISON:  None. FINDINGS: No evidence of fracture, dislocation, or joint effusion. No evidence of arthropathy or other focal bone abnormality. Soft tissues are unremarkable. IMPRESSION: No acute abnormality noted. Electronically Signed   By: 07/26/2020 M.D.   On: 08/02/2020 17:38    Procedures Procedures (including critical care time)  Medications Ordered in UC Medications - No data to display  Initial Impression / Assessment and Plan / UC Course  I have reviewed the triage vital signs and the nursing notes.  Pertinent labs & imaging results that were available during my care of the patient were reviewed by me and considered in my medical decision making (see chart for details).   Patient is a very pleasant 40 year old female here for evaluation of multiple complaints.  The first complaint is pain in  her right knee that is been present for the past week.  She is not aware of any injury.  She reports that she feels a popping and clicking when she flexes and extends her knee.  The pain is worse when she goes up and down steps.  And she states it feels like it is going to give out.  Physical exam reveals tenderness to palpation of the patella, tibial tuberosity, medial and lateral joint lines, and pain with varus and valgus stress.  No popping or clicking palpated over the patella with passive range of motion of the right knee.  No effusion palpated.  Will obtain radiograph of right knee.  Patient's rash consists of hyperpigmented skin in the posterior crease of the neck, the lateral aspect of the left lower leg, and patient reports that she has similar rash under both breasts.  Patient reports that the rash is itchy and initially it was scaly.  The lesions on the neck do not have any scale at present.  There are scattered lesions on the left lower extremity that do have some scale.  There is no erythema, edema, or warmth.  Allergy interpretation of right knee x-rays states that it is negative for pressure, dislocation, or joint effusion.  No arthropathy is noted.  Patient's physical exam is consistent with arthritis.  Will discharge patient home on meloxicam 7.5 mg and have her continue to wear her copper fit sleeve to help with inflammation and stability.  Neck rash is consistent with acanthosis nigricans.  Will have her follow-up with her primary care provider.  She is requesting topical antifungal.  The patient does not have a primary care provider so will arrange referral.   Final Clinical Impressions(s) / UC Diagnoses   Final diagnoses:  Arthritis     Discharge Instructions     Take the meloxicam daily with food.  Continue to wear your copper fit sleeve on your right knee to aid in stability and help with swelling and pain.  Keep your right leg elevated as much as possible and take  frequent breaks when standing on hard concrete floors at work.  Ice her knees when you get home from work for 20 minutes help decrease swelling and help with pain.  I have made a referral for assistance to help you find a primary care provider to have your rash further evaluated and to have you worked up for possible insulin resistance or developing diabetes.    ED Prescriptions    Medication Sig Dispense Auth. Provider   meloxicam (MOBIC) 7.5 MG tablet Take 1 tablet (7.5 mg total) by mouth daily. 30 tablet Becky Augusta, NP   nystatin cream (MYCOSTATIN) Apply to affected area 2 times daily 30 g Becky Augusta, NP     PDMP not reviewed this encounter.   Becky Augusta, NP 08/02/20 1757

## 2020-08-02 NOTE — ED Triage Notes (Signed)
Pt c/o right knee pain, no injury, pt works on her feet all day. Pt also reports rash to the back of neck and on her left lower leg. Pt does have history of heat rash.

## 2020-08-02 NOTE — Discharge Instructions (Addendum)
Take the meloxicam daily with food.  Continue to wear your copper fit sleeve on your right knee to aid in stability and help with swelling and pain.  Keep your right leg elevated as much as possible and take frequent breaks when standing on hard concrete floors at work.  Ice her knees when you get home from work for 20 minutes help decrease swelling and help with pain.  I have made a referral for assistance to help you find a primary care provider to have your rash further evaluated and to have you worked up for possible insulin resistance or developing diabetes.

## 2020-10-09 ENCOUNTER — Encounter: Payer: Self-pay | Admitting: Emergency Medicine

## 2020-10-09 ENCOUNTER — Other Ambulatory Visit: Payer: Self-pay

## 2020-10-09 ENCOUNTER — Ambulatory Visit
Admission: EM | Admit: 2020-10-09 | Discharge: 2020-10-09 | Disposition: A | Discharge status: Home / Self Care | Payer: BC Managed Care – PPO | Attending: Sports Medicine | Admitting: Sports Medicine

## 2020-10-09 DIAGNOSIS — K029 Dental caries, unspecified: Secondary | ICD-10-CM

## 2020-10-09 DIAGNOSIS — K0889 Other specified disorders of teeth and supporting structures: Secondary | ICD-10-CM

## 2020-10-09 MED ORDER — AMOXICILLIN 500 MG PO CAPS: 500.0000 mg | ORAL_CAPSULE | Freq: Three times a day (TID) | ORAL | 0 refills | Status: DC

## 2020-10-09 NOTE — Discharge Instructions (Addendum)
Med to your pharmacy Contact your dentist

## 2020-10-09 NOTE — ED Triage Notes (Signed)
Pt c/o tooth pain. Started about 3 days ago. Pain is located on the bottom right side. She states she has been taken ibuprofen without relief. Pt tearful during triage. She states her jaw is swollen and the tooth is making her head hurt.

## 2020-10-09 NOTE — ED Provider Notes (Signed)
[facility]    CSN: [id number] Arrival date & time: 10/09/20  1153      History   Chief Complaint Chief Complaint  Patient presents with  . Dental Pain    HPI Autumn Cross is a 40 y.o. female.   Patient is a pleasant 40 year old female who presents for evaluation of right-sided lower tooth and gum pain.  She has had it for now 3 days.  No fever shakes chills.  No nausea vomiting or diarrhea.  She says she has a little bit of a sore throat and a headache.  She has been taking some ibuprofen.  It was noted that she was tearful during triage.  She thinks her jaw swollen.  She thinks her tooth is making her head hurt.  She is having difficulty eating.  She denies any vision changes with her headache.  No nausea vomiting diarrhea.  No chest pain shortness of breath.  No red flag signs or symptoms elicited on history.      Past Medical History:  Diagnosis Date  . Herpes     There are no problems to display for this patient.   Past Surgical History:  Procedure Laterality Date  . NO PAST SURGERIES      OB History   No obstetric history on file.      Home Medications    Prior to Admission medications   Medication Sig Start Date End Date Taking? Authorizing Provider  amoxicillin (AMOXIL) 500 MG capsule Take 1 capsule (500 mg total) by mouth 3 (three) times daily. 10/09/20  Yes Delton See, MD  meloxicam (MOBIC) 7.5 MG tablet Take 1 tablet (7.5 mg total) by mouth daily. 08/02/20   Becky Augusta, NP  nystatin cream (MYCOSTATIN) Apply to affected area 2 times daily 08/02/20   Becky Augusta, NP  omeprazole (PRILOSEC) 40 MG capsule Take 1 capsule (40 mg total) by mouth daily. 02/05/19 06/20/19  Emily Filbert, MD  sucralfate (CARAFATE) 1 g tablet Take 1 tablet (1 g total) by mouth 4 (four) times daily. 02/05/19 06/20/19  Emily Filbert, MD    Family History Family History  Problem Relation Age of Onset  . Healthy Mother   . Healthy Father      Social History Social History   Tobacco Use  . Smoking status: Former Smoker    Types: Cigars    Quit date: 04/07/2017    Years since quitting: 3.5  . Smokeless tobacco: Never Used  Vaping Use  . Vaping Use: Never used  Substance Use Topics  . Alcohol use: Yes    Comment: rare  . Drug use: No     Allergies   Naproxen and Percocet [oxycodone-acetaminophen]   Review of Systems Review of Systems  Constitutional: Positive for appetite change. Negative for chills, diaphoresis, fatigue and fever.  HENT: Positive for dental problem. Negative for congestion, drooling, ear pain, postnasal drip, rhinorrhea, sinus pressure, sinus pain, sneezing, sore throat and trouble swallowing.   Eyes: Negative for pain.  Respiratory: Negative for cough, chest tightness and shortness of breath.   Cardiovascular: Negative for chest pain and palpitations.  Gastrointestinal: Negative for abdominal pain, diarrhea, nausea and vomiting.  Genitourinary: Negative for dysuria.  Musculoskeletal: Negative for back pain, myalgias, neck pain and neck stiffness.  Skin: Negative for color change, pallor, rash and wound.  Neurological: Positive for headaches. Negative for dizziness, light-headedness and numbness.  All other systems reviewed and are negative.    Physical Exam Triage Vital Signs  ED Triage Vitals  Enc Vitals Group     BP 10/09/20 1211 136/79     Pulse Rate 10/09/20 1211 (!) 118     Resp 10/09/20 1211 18     Temp 10/09/20 1211 99.3 F (37.4 C)     Temp Source 10/09/20 1211 Oral     SpO2 10/09/20 1211 98 %     Weight 10/09/20 1208 210 lb 1.6 oz (95.3 kg)     Height 10/09/20 1208 5\' 3"  (1.6 m)     Head Circumference --      Peak Flow --      Pain Score 10/09/20 1208 10     Pain Loc --      Pain Edu? --      Excl. in GC? --    No data found.  Updated Vital Signs BP 136/79 (BP Location: Right Arm)   Pulse (!) 118 Comment: pt crying due to pain  Temp 99.3 F (37.4 C) (Oral)    Resp 18   Ht 5\' 3"  (1.6 m)   Wt 95.3 kg   LMP 10/03/2020 (Approximate)   SpO2 98%   BMI 37.22 kg/m   Visual Acuity Right Eye Distance:   Left Eye Distance:   Bilateral Distance:    Right Eye Near:   Left Eye Near:    Bilateral Near:     Physical Exam Vitals and nursing note reviewed.  Constitutional:      General: She is not in acute distress.    Appearance: Normal appearance. She is not ill-appearing, toxic-appearing or diaphoretic.  HENT:     Head: Normocephalic and atraumatic.     Nose: Nose normal.     Mouth/Throat:     Mouth: Mucous membranes are moist.      Comments: There is a infected dental carry the on the right lower tooth as outlined above.  Some erythema around the gum.  No actual abscess.  Tender to palpation.  Poor dentition. Eyes:     Conjunctiva/sclera: Conjunctivae normal.     Pupils: Pupils are equal, round, and reactive to light.  Cardiovascular:     Rate and Rhythm: Normal rate and regular rhythm.     Pulses: Normal pulses.     Heart sounds: Normal heart sounds. No murmur heard. No friction rub. No gallop.   Pulmonary:     Effort: Pulmonary effort is normal.     Breath sounds: Normal breath sounds. No stridor. No wheezing, rhonchi or rales.  Musculoskeletal:     Cervical back: Normal range of motion and neck supple. No rigidity or tenderness.  Lymphadenopathy:     Cervical: Cervical adenopathy present.  Skin:    General: Skin is warm and dry.     Capillary Refill: Capillary refill takes less than 2 seconds.  Neurological:     General: No focal deficit present.     Mental Status: She is alert and oriented to person, place, and time.      UC Treatments / Results  Labs (all labs ordered are listed, but only abnormal results are displayed) Labs Reviewed - No data to display  EKG   Radiology No results found.  Procedures Procedures (including critical care time)  Medications Ordered in UC Medications - No data to display  Initial  Impression / Assessment and Plan / UC Course  I have reviewed the triage vital signs and the nursing notes.  Pertinent labs & imaging results that were available during my care of the patient were  reviewed by me and considered in my medical decision making (see chart for details).  Clinical impression: Right lower jaw pain with a dental carry and infection of the surrounding gum with erythema and some mild swelling.  No abscess.  Treatment plan: 1.  The findings and treatment plan were discussed in detail with the patient.  Patient was in agreement. 2.  Went ahead and prescribed amoxicillin.  She will get that from her pharmacy. 3.  Encouraged her that she needs to see a dentist.  She is concerned because she does not have insurance.  She is going to seek out care with the [facility] dental school. 4.  Continue with Tylenol or ibuprofen.  No narcotics. 5.  If symptoms worsen she should go to the ER. 6.  She was discharged in stable condition and will follow-up here as needed.    Final Clinical Impressions(s) / UC Diagnoses   Final diagnoses:  Pain, dental  Dental caries     Discharge Instructions     Med to your pharmacy Contact your dentist    ED Prescriptions    Medication Sig Dispense Auth. Provider   amoxicillin (AMOXIL) 500 MG capsule Take 1 capsule (500 mg total) by mouth 3 (three) times daily. 21 capsule Delton See, MD     PDMP not reviewed this encounter.   Delton See, MD 10/13/20 [phone]

## 2020-11-05 ENCOUNTER — Other Ambulatory Visit: Payer: Self-pay

## 2020-11-05 ENCOUNTER — Ambulatory Visit
Admission: EM | Admit: 2020-11-05 | Discharge: 2020-11-05 | Disposition: A | Discharge status: Home / Self Care | Payer: BC Managed Care – PPO | Attending: Internal Medicine | Admitting: Internal Medicine

## 2020-11-05 ENCOUNTER — Encounter: Payer: Self-pay | Admitting: Emergency Medicine

## 2020-11-05 DIAGNOSIS — K0889 Other specified disorders of teeth and supporting structures: Secondary | ICD-10-CM | POA: Diagnosis not present

## 2020-11-05 DIAGNOSIS — H15102 Unspecified episcleritis, left eye: Secondary | ICD-10-CM | POA: Diagnosis not present

## 2020-11-05 DIAGNOSIS — H15002 Unspecified scleritis, left eye: Secondary | ICD-10-CM

## 2020-11-05 MED ORDER — TRAMADOL HCL 50 MG PO TABS: 50.0000 mg | ORAL_TABLET | Freq: Four times a day (QID) | ORAL | 0 refills | Status: DC | PRN

## 2020-11-05 MED ORDER — PENICILLIN V POTASSIUM 500 MG PO TABS: 500.0000 mg | ORAL_TABLET | Freq: Four times a day (QID) | ORAL | 0 refills | Status: AC

## 2020-11-05 NOTE — ED Triage Notes (Signed)
Pt c/o tooth pain. She states she thinks it is a top right tooth. Started about a month ago. She was suppose to have a dental appt last week but missed it.

## 2020-11-05 NOTE — Discharge Instructions (Addendum)
Use saline eye drops 2 drops into your left eye 4-5 times a day for 5-7 days Use Avocado oil on the dry skin areas of your  external ear, but dont dry off the wetness from the shower

## 2020-11-05 NOTE — ED Provider Notes (Signed)
[facility]    CSN: [id number] Arrival date & time: 11/05/20  1016      History   Chief Complaint Chief Complaint  Patient presents with   Dental Pain    HPI Autumn Cross is a 40 y.o. female who presents with R upper molar carious tooth x 1 month. Missed her apt last week at the dentist due to working and forgetting  about it. Has 8 teeth that need work and some extracted. Denies fever. The pain has been shooting up to her face and head.     Past Medical History:  Diagnosis Date   Herpes     There are no problems to display for this patient.   Past Surgical History:  Procedure Laterality Date   NO PAST SURGERIES      OB History   No obstetric history on file.      Home Medications    Prior to Admission medications   Medication Sig Start Date End Date Taking? Authorizing Provider  penicillin v potassium (VEETID) 500 MG tablet Take 1 tablet (500 mg total) by mouth 4 (four) times daily for 10 days. 11/05/20 11/15/20 Yes Rodriguez-Southworth, Nettie Elm, PA-C  traMADol (ULTRAM) 50 MG tablet Take 1 tablet (50 mg total) by mouth every 6 (six) hours as needed. 11/05/20  Yes Rodriguez-Southworth, Nettie Elm, PA-C  omeprazole (PRILOSEC) 40 MG capsule Take 1 capsule (40 mg total) by mouth daily. 02/05/19 06/20/19  Emily Filbert, MD  sucralfate (CARAFATE) 1 g tablet Take 1 tablet (1 g total) by mouth 4 (four) times daily. 02/05/19 06/20/19  Emily Filbert, MD    Family History Family History  Problem Relation Age of Onset   Healthy Mother    Healthy Father     Social History Social History   Tobacco Use   Smoking status: Former    Pack years: 0.00    Types: Cigars    Quit date: 04/07/2017    Years since quitting: 3.5   Smokeless tobacco: Never  Vaping Use   Vaping Use: Never used  Substance Use Topics   Alcohol use: Yes    Comment: rare   Drug use: No     Allergies   Naproxen and Percocet [oxycodone-acetaminophen]   Review of  Systems Review of Systems  Constitutional:  Negative for chills, diaphoresis and fever.  HENT:  Positive for dental problem. Negative for congestion and rhinorrhea.     Physical Exam Triage Vital Signs ED Triage Vitals  Enc Vitals Group     BP 11/05/20 1058 119/70     Pulse Rate 11/05/20 1058 66     Resp 11/05/20 1058 18     Temp 11/05/20 1058 98.9 F (37.2 C)     Temp Source 11/05/20 1058 Oral     SpO2 11/05/20 1058 100 %     Weight 11/05/20 1056 210 lb 1.6 oz (95.3 kg)     Height 11/05/20 1056 5\' 3"  (1.6 m)     Head Circumference --      Peak Flow --      Pain Score 11/05/20 1056 6     Pain Loc --      Pain Edu? --      Excl. in GC? --    No data found.  Updated Vital Signs BP 119/70 (BP Location: Right Arm)   Pulse 66   Temp 98.9 F (37.2 C) (Oral)   Resp 18   Ht 5\' 3"  (1.6 m)   Wt 210  lb 1.6 oz (95.3 kg)   LMP 11/03/2020   SpO2 100%   BMI 37.22 kg/m   Visual Acuity Right Eye Distance:   Left Eye Distance:   Bilateral Distance:    Right Eye Near:   Left Eye Near:    Bilateral Near:     Physical Exam Vitals and nursing note reviewed.  Constitutional:      General: She is not in acute distress.    Appearance: Normal appearance. She is not toxic-appearing.  HENT:     Head: Normocephalic.     Right Ear: External ear normal.     Left Ear: External ear normal.     Mouth/Throat:     Mouth: Mucous membranes are moist.     Pharynx: Oropharynx is clear.     Comments: L upper mild molar with caries and tender when I tap it. Her gum looks normal around this tooth.  Eyes:     General: No scleral icterus.    Conjunctiva/sclera: Conjunctivae normal.  Pulmonary:     Effort: Pulmonary effort is normal.  Musculoskeletal:        General: Normal range of motion.     Cervical back: Neck supple.  Lymphadenopathy:     Cervical: No cervical adenopathy.  Skin:    General: Skin is warm and dry.     Findings: No rash.  Neurological:     Mental Status: She is alert  and oriented to person, place, and time.     Gait: Gait normal.  Psychiatric:        Mood and Affect: Mood normal.        Behavior: Behavior normal.        Thought Content: Thought content normal.        Judgment: Judgment normal.     UC Treatments / Results  Labs (all labs ordered are listed, but only abnormal results are displayed) Labs Reviewed - No data to display  EKG   Radiology No results found.  Procedures Procedures (including critical care time)  Medications Ordered in UC Medications - No data to display  Initial Impression / Assessment and Plan / UC Course  I have reviewed the triage vital signs and the nursing notes. Dental pain with possibly infection. I placed her on Penicillin and Tramadol as noted.  Needs to follow up with her dentist as scheduled.     Final Clinical Impressions(s) / UC Diagnoses   Final diagnoses:  Pain, dental  Scleritis and episcleritis of left eye     Discharge Instructions      Use saline eye drops 2 drops into your left eye 4-5 times a day for 5-7 days Use Avocado oil on the dry skin areas of your  external ear, but dont dry off the wetness from the shower     ED Prescriptions     Medication Sig Dispense Auth. Provider   penicillin v potassium (VEETID) 500 MG tablet Take 1 tablet (500 mg total) by mouth 4 (four) times daily for 10 days. 40 tablet Rodriguez-Southworth, Aili Casillas, PA-C   traMADol (ULTRAM) 50 MG tablet Take 1 tablet (50 mg total) by mouth every 6 (six) hours as needed. 15 tablet Rodriguez-Southworth, Nettie Elm, PA-C      I have reviewed the PDMP during this encounter.   Garey Ham, PA-C 11/05/20 1318

## 2021-05-08 ENCOUNTER — Other Ambulatory Visit: Payer: Self-pay

## 2021-05-08 ENCOUNTER — Ambulatory Visit
Admission: EM | Admit: 2021-05-08 | Discharge: 2021-05-08 | Disposition: A | Discharge status: Home / Self Care | Payer: BC Managed Care – PPO | Attending: Emergency Medicine | Admitting: Emergency Medicine

## 2021-05-08 DIAGNOSIS — J069 Acute upper respiratory infection, unspecified: Secondary | ICD-10-CM

## 2021-05-08 MED ORDER — PROMETHAZINE-DM 6.25-15 MG/5ML PO SYRP: 5.0000 mL | ORAL_SOLUTION | Freq: Four times a day (QID) | ORAL | 0 refills | Status: DC | PRN

## 2021-05-08 MED ORDER — IPRATROPIUM BROMIDE 0.06 % NA SOLN: 2.0000 | Freq: Four times a day (QID) | NASAL | 12 refills | Status: DC

## 2021-05-08 MED ORDER — BENZONATATE 100 MG PO CAPS: 200.0000 mg | ORAL_CAPSULE | Freq: Three times a day (TID) | ORAL | 0 refills | Status: DC

## 2021-05-08 NOTE — ED Provider Notes (Signed)
[facility]    CSN: [id number] Arrival date & time: 05/08/21  1343      History   Chief Complaint Chief Complaint  Patient presents with   Cough    HPI Autumn Cross is a 40 y.o. female.   HPI  40 year old female here for evaluation of respiratory complaints.  Patient reports that for the last 2 days she has been experiencing nasal congestion runny nose for green nasal discharge, right ear pain, burning in her chest, and a cough that is intermittently productive.  She also endorses mild shortness of breath and wheezing when she coughs only.  In addition, she is complaining of pain in her back when she coughs.  She denies fever, vomiting, or diarrhea.  Past Medical History:  Diagnosis Date   Herpes     There are no problems to display for this patient.   Past Surgical History:  Procedure Laterality Date   NO PAST SURGERIES      OB History   No obstetric history on file.      Home Medications    Prior to Admission medications   Medication Sig Start Date End Date Taking? Authorizing Provider  benzonatate (TESSALON) 100 MG capsule Take 2 capsules (200 mg total) by mouth every 8 (eight) hours. 05/08/21  Yes Becky Augusta, NP  ipratropium (ATROVENT) 0.06 % nasal spray Place 2 sprays into both nostrils 4 (four) times daily. 05/08/21  Yes Becky Augusta, NP  promethazine-dextromethorphan (PROMETHAZINE-DM) 6.25-15 MG/5ML syrup Take 5 mLs by mouth 4 (four) times daily as needed. 05/08/21  Yes Becky Augusta, NP  omeprazole (PRILOSEC) 40 MG capsule Take 1 capsule (40 mg total) by mouth daily. 02/05/19 06/20/19  Emily Filbert, MD  sucralfate (CARAFATE) 1 g tablet Take 1 tablet (1 g total) by mouth 4 (four) times daily. 02/05/19 06/20/19  Emily Filbert, MD    Family History Family History  Problem Relation Age of Onset   Healthy Mother    Healthy Father     Social History Social History   Tobacco Use   Smoking status: Former    Types: Cigars     Quit date: 04/07/2017    Years since quitting: 4.0   Smokeless tobacco: Never  Vaping Use   Vaping Use: Never used  Substance Use Topics   Alcohol use: Yes    Comment: rare   Drug use: No     Allergies   Naproxen and Percocet [oxycodone-acetaminophen]   Review of Systems Review of Systems  Constitutional:  Negative for activity change, appetite change and fever.  HENT:  Positive for congestion, ear pain and rhinorrhea. Negative for sore throat.   Respiratory:  Positive for cough, shortness of breath and wheezing.   Gastrointestinal:  Negative for diarrhea, nausea and vomiting.  Musculoskeletal:  Positive for back pain.  Skin:  Negative for rash.  Hematological: Negative.   Psychiatric/Behavioral: Negative.      Physical Exam Triage Vital Signs ED Triage Vitals  Enc Vitals Group     BP 05/08/21 1609 90/80     Pulse Rate 05/08/21 1609 88     Resp 05/08/21 1609 18     Temp 05/08/21 1609 99.3 F (37.4 C)     Temp Source 05/08/21 1609 Oral     SpO2 05/08/21 1609 99 %     Weight 05/08/21 1608 200 lb (90.7 kg)     Height 05/08/21 1608 5\' 3"  (1.6 m)     Head Circumference --  Peak Flow --      Pain Score 05/08/21 1608 0     Pain Loc --      Pain Edu? --      Excl. in GC? --    No data found.  Updated Vital Signs BP 90/80 (BP Location: Left Arm)   Pulse 88   Temp 99.3 F (37.4 C) (Oral)   Resp 18   Ht 5\' 3"  (1.6 m)   Wt 200 lb (90.7 kg)   LMP 05/03/2021   SpO2 99%   BMI 35.43 kg/m   Visual Acuity Right Eye Distance:   Left Eye Distance:   Bilateral Distance:    Right Eye Near:   Left Eye Near:    Bilateral Near:     Physical Exam Vitals and nursing note reviewed.  Constitutional:      General: She is not in acute distress.    Appearance: Normal appearance. She is not ill-appearing.  HENT:     Head: Normocephalic and atraumatic.     Right Ear: Tympanic membrane, ear canal and external ear normal. There is no impacted cerumen.     Left Ear:  Tympanic membrane, ear canal and external ear normal. There is no impacted cerumen.     Nose: Congestion and rhinorrhea present.     Mouth/Throat:     Mouth: Mucous membranes are moist.     Pharynx: Oropharynx is clear. Posterior oropharyngeal erythema present.  Cardiovascular:     Rate and Rhythm: Normal rate and regular rhythm.     Pulses: Normal pulses.     Heart sounds: Normal heart sounds. No murmur heard.   No gallop.  Pulmonary:     Effort: Pulmonary effort is normal.     Breath sounds: Normal breath sounds. No wheezing, rhonchi or rales.  Musculoskeletal:     Cervical back: Normal range of motion and neck supple.  Lymphadenopathy:     Cervical: No cervical adenopathy.  Skin:    General: Skin is warm and dry.     Capillary Refill: Capillary refill takes less than 2 seconds.     Findings: No erythema or rash.  Neurological:     General: No focal deficit present.     Mental Status: She is alert and oriented to person, place, and time.  Psychiatric:        Mood and Affect: Mood normal.        Behavior: Behavior normal.        Thought Content: Thought content normal.        Judgment: Judgment normal.     UC Treatments / Results  Labs (all labs ordered are listed, but only abnormal results are displayed) Labs Reviewed - No data to display  EKG   Radiology No results found.  Procedures Procedures (including critical care time)  Medications Ordered in UC Medications - No data to display  Initial Impression / Assessment and Plan / UC Course  I have reviewed the triage vital signs and the nursing notes.  Pertinent labs & imaging results that were available during my care of the patient were reviewed by me and considered in my medical decision making (see chart for details).  Patient is a nontoxic-appearing 57-year-old female here for evaluation of respiratory complaints as outlined in HPI above.  On physical exam patient has moderately ceruminous external auditory  canals but I am able to visualize the upper rim of the tympanic membrane.  Both tympanic membranes are pearly gray in appearance.  Nasal  mucosa is erythematous and edematous with clear nasal discharge in both nares.  Oropharyngeal exam reveals posterior oropharyngeal erythema and injection with clear postnasal drip.  No cervical lymphadenopathy appreciated on exam.  Cardiopulmonary exam reveals clear lung sounds all fields.  Patient's exam is consistent with a viral URI with cough.  We will treat with Atrovent nasal spray, Tessalon Perles, and Promethazine DM cough syrup.  I do not feel an antibiotic is warranted at this time.  I have advised the patient that if her symptoms continue, or they worsen, to return for reevaluation.  Work note provided.   Final Clinical Impressions(s) / UC Diagnoses   Final diagnoses:  Viral URI with cough     Discharge Instructions      Use the Atrovent nasal spray, 2 squirts in each nostril every 6 hours, as needed for runny nose and postnasal drip.  Use the Tessalon Perles every 8 hours during the day.  Take them with a small sip of water.  They may give you some numbness to the base of your tongue or a metallic taste in your mouth, this is normal.  Use the Promethazine DM cough syrup at bedtime for cough and congestion.  It will make you drowsy so do not take it during the day.  Return for reevaluation or see your primary care provider for any new or worsening symptoms.      ED Prescriptions     Medication Sig Dispense Auth. Provider   benzonatate (TESSALON) 100 MG capsule Take 2 capsules (200 mg total) by mouth every 8 (eight) hours. 21 capsule Becky Augusta, NP   ipratropium (ATROVENT) 0.06 % nasal spray Place 2 sprays into both nostrils 4 (four) times daily. 15 mL Becky Augusta, NP   promethazine-dextromethorphan (PROMETHAZINE-DM) 6.25-15 MG/5ML syrup Take 5 mLs by mouth 4 (four) times daily as needed. 118 mL Becky Augusta, NP      PDMP not reviewed  this encounter.   Becky Augusta, NP 05/08/21 [phone]

## 2021-05-08 NOTE — ED Triage Notes (Signed)
Pt here with C/O cough, burning, runny nose, denies fever. C/O ear pain

## 2021-05-08 NOTE — Discharge Instructions (Signed)

## 2021-09-17 IMAGING — CR DG KNEE COMPLETE 4+V*R*
4 series · 4 of 4 positions shown · non-contrast
Comparison: None.

CLINICAL DATA: Right knee pain, no known injury, initial encounter

EXAM:
RIGHT KNEE - COMPLETE 4+ VIEW

[knee ap]
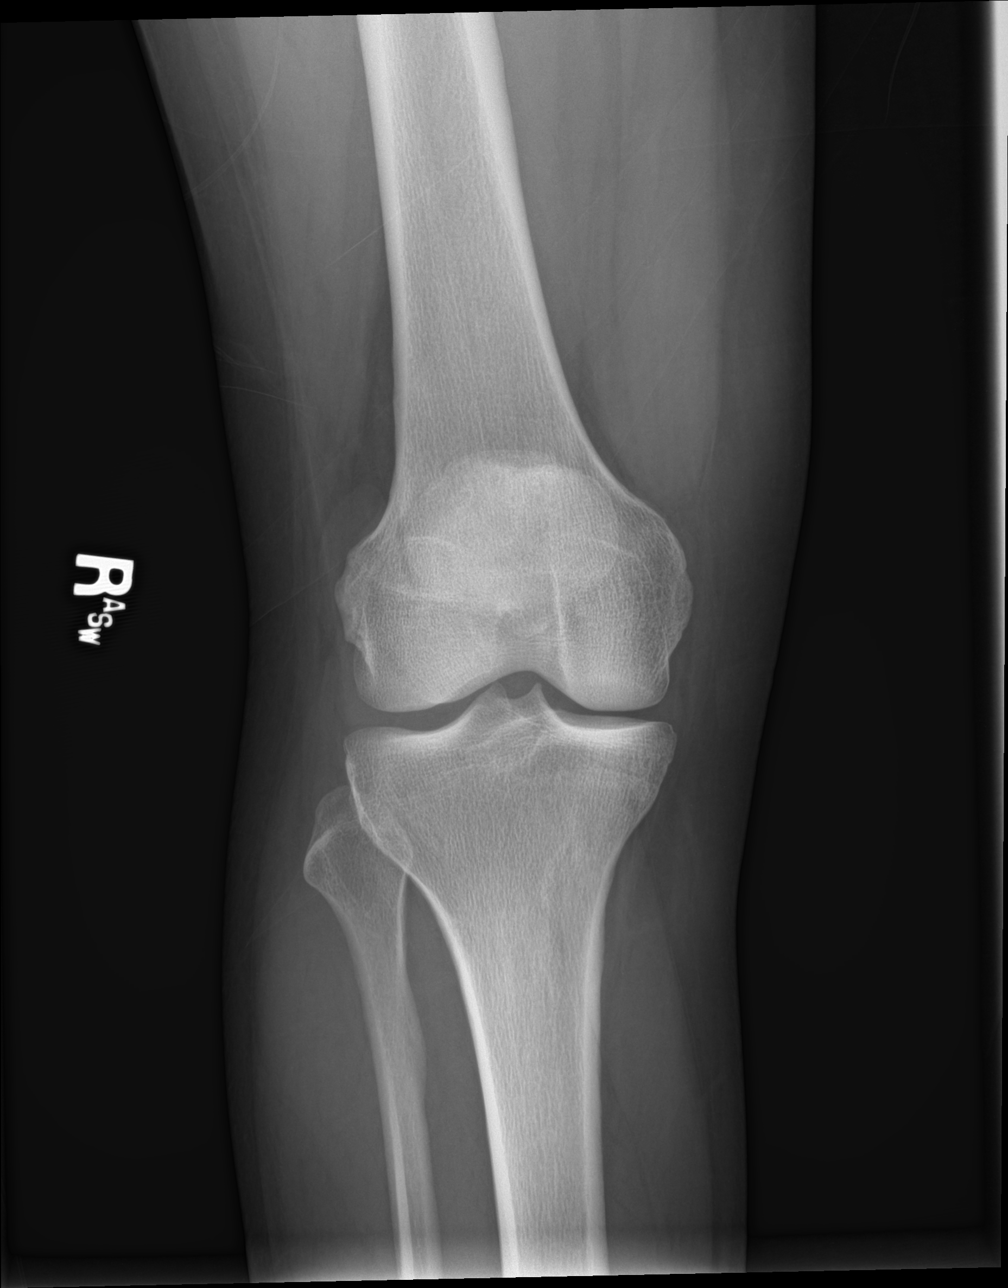

[knee lat]
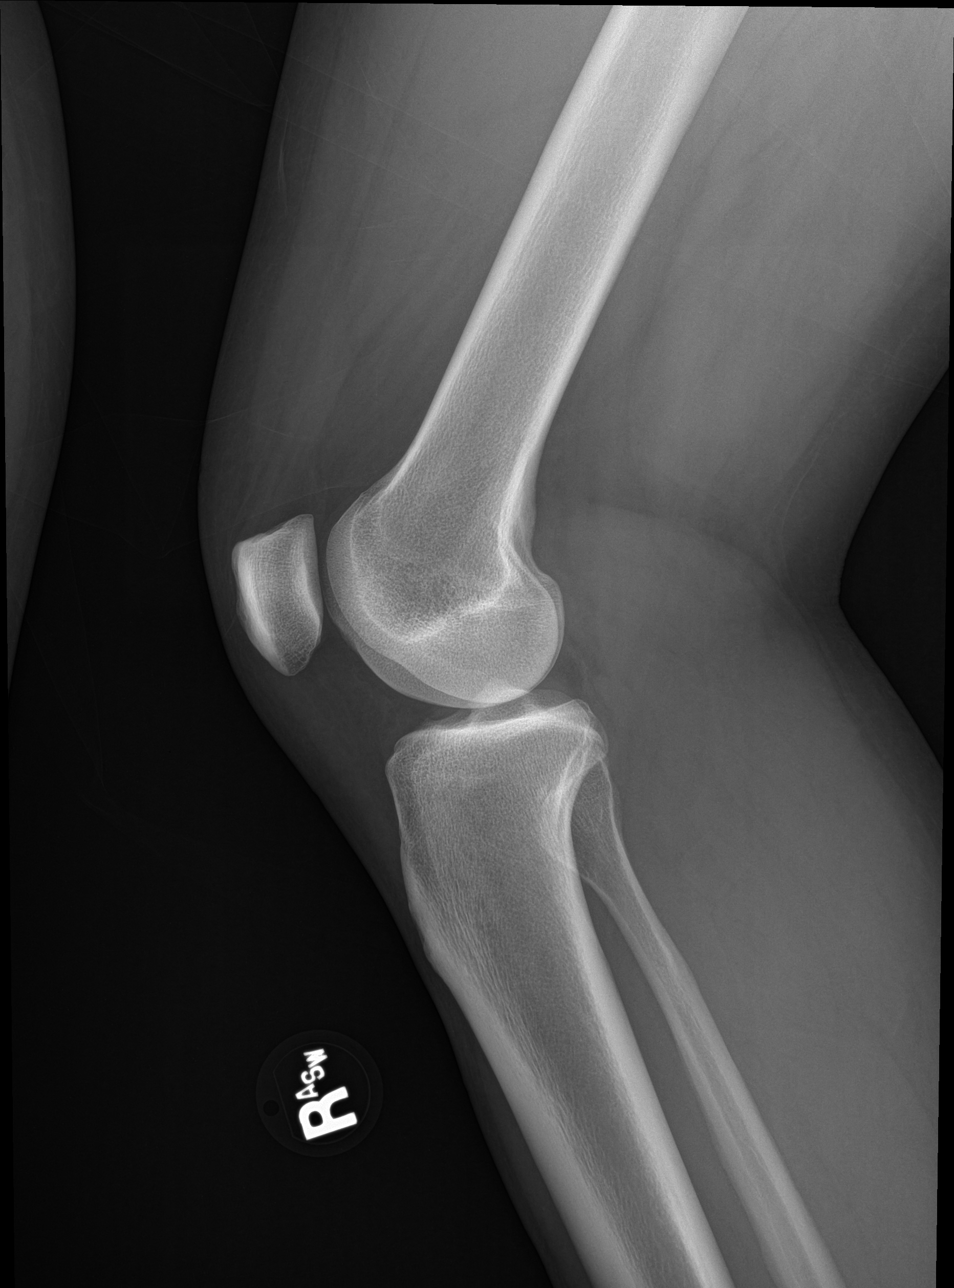

[knee obl (1 of 2)]
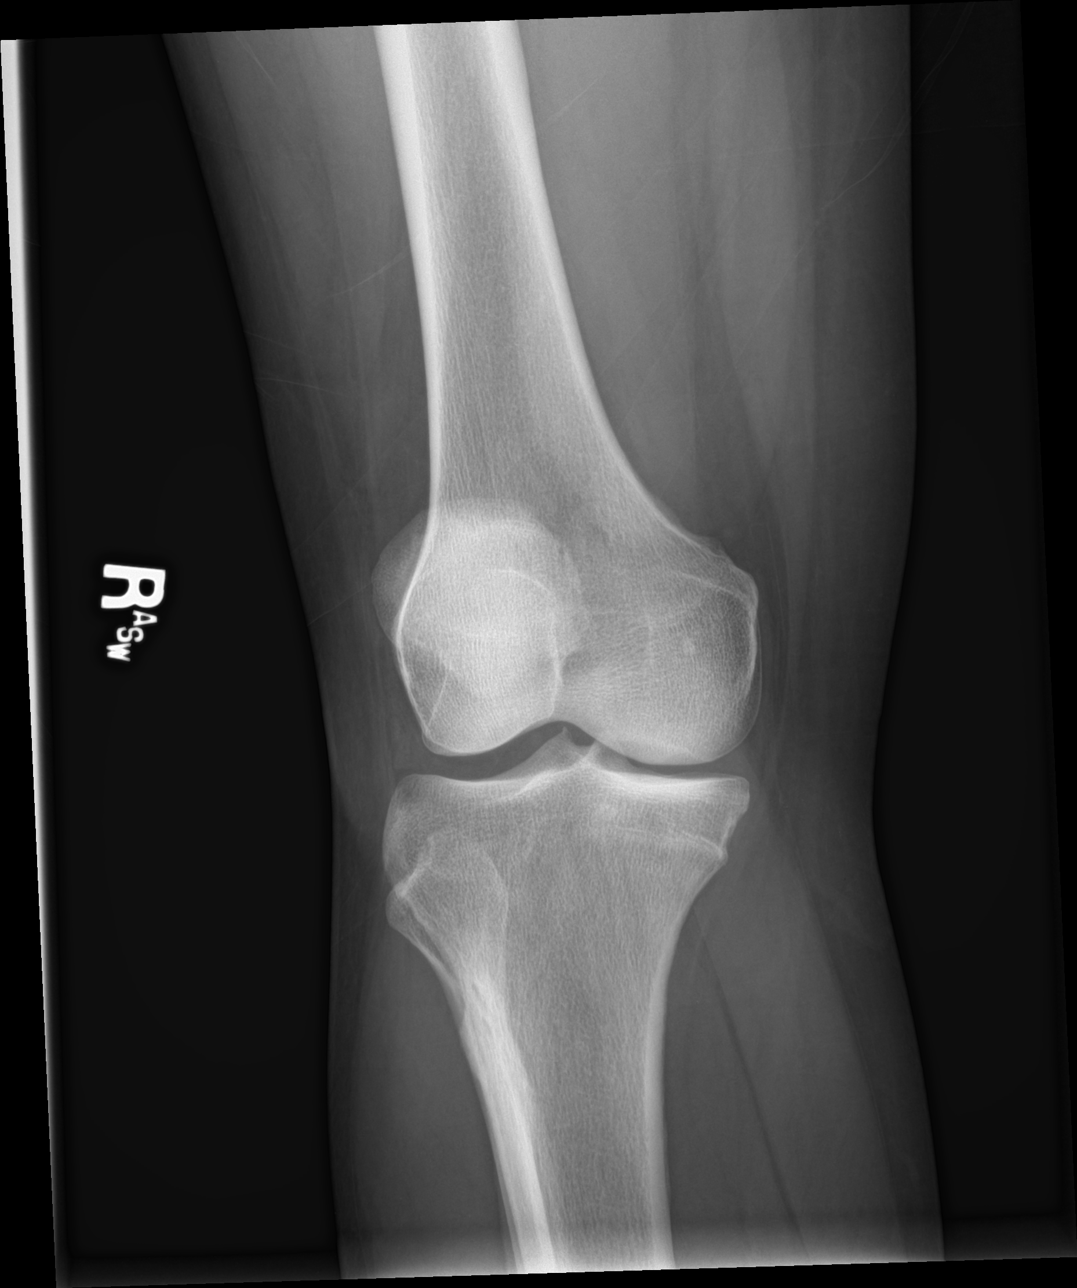

[knee obl (2 of 2)]
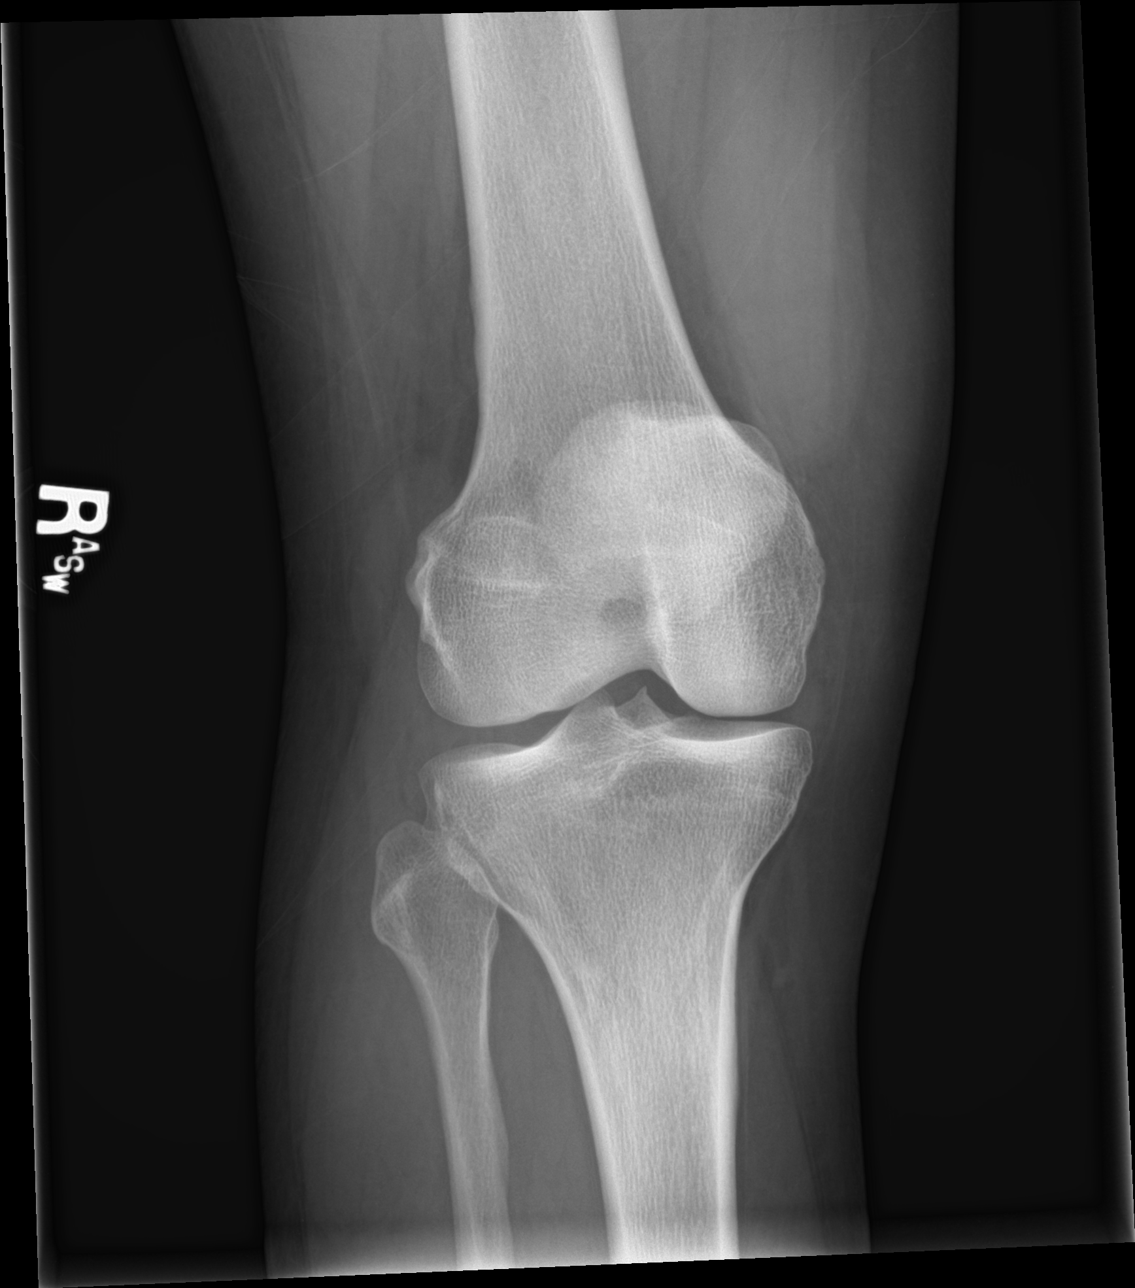

[4 of 4 positions shown; findings below may reference images not displayed]

FINDINGS: No evidence of fracture, dislocation, or joint effusion. No evidence
of arthropathy or other focal bone abnormality. Soft tissues are
unremarkable.
IMPRESSION: No acute abnormality noted.

## 2021-10-07 ENCOUNTER — Ambulatory Visit
Admission: EM | Admit: 2021-10-07 | Discharge: 2021-10-07 | Disposition: A | Discharge status: Home / Self Care | Payer: BC Managed Care – PPO | Attending: Physician Assistant | Admitting: Physician Assistant

## 2021-10-07 DIAGNOSIS — R238 Other skin changes: Secondary | ICD-10-CM | POA: Diagnosis not present

## 2021-10-07 DIAGNOSIS — W57XXXA Bitten or stung by nonvenomous insect and other nonvenomous arthropods, initial encounter: Secondary | ICD-10-CM | POA: Diagnosis not present

## 2021-10-07 DIAGNOSIS — S0096XA Insect bite (nonvenomous) of unspecified part of head, initial encounter: Secondary | ICD-10-CM

## 2021-10-07 MED ORDER — DOXYCYCLINE HYCLATE 100 MG PO CAPS: 100.0000 mg | ORAL_CAPSULE | Freq: Two times a day (BID) | ORAL | 0 refills | Status: AC

## 2021-10-07 MED ORDER — MUPIROCIN 2 % EX OINT: 1.0000 "application " | TOPICAL_OINTMENT | Freq: Two times a day (BID) | CUTANEOUS | 0 refills | Status: DC

## 2021-10-07 NOTE — ED Triage Notes (Signed)
Pt c/o spider bite between the eyes. Pt believes she was bitten near Monday.  ? ?Pt states that since the bite her toes on the left foot tingle and she feels off.  ?

## 2021-10-07 NOTE — Discharge Instructions (Addendum)
-  Keep area clean and dry ?-Apply antibiotic ointment twice daily ?-Take short course of oral antibiotics for possible infection ?-Rest and increase your fluids ?-Follow up if any worsening of symptoms ?

## 2021-10-07 NOTE — ED Provider Notes (Signed)
?[facility] ? ? ? ?CSN: [id number] ?Arrival date & time: 10/07/21  1035 ? ? ?  ? ?History   ?Chief Complaint ?Chief Complaint  ?Patient presents with  ? Insect Bite  ? ? ?HPI ?Autumn Cross is a 41 y.o. female presenting for an area of redness and swelling between her eyebrows that started about 6 days ago.  She states that his enlarged in size.  She says blood and clear to light yellowish fluid come out when she squeezes it.  She believes she could have been bitten by spider but never saw an insect or spider.  She says they are around her apartment though.  Has been applying over-the-counter ointments without relief.  No associated fevers or facial swelling.  No history of recurrent skin infections or MRSA.  No other complaints. ? ?HPI ? ?Past Medical History:  ?Diagnosis Date  ? Herpes   ? ? ?There are no problems to display for this patient. ? ? ?Past Surgical History:  ?Procedure Laterality Date  ? NO PAST SURGERIES    ? ? ?OB History   ?No obstetric history on file. ?  ? ? ? ?Home Medications   ? ?Prior to Admission medications   ?Medication Sig Start Date End Date Taking? Authorizing Provider  ?doxycycline (VIBRAMYCIN) 100 MG capsule Take 1 capsule (100 mg total) by mouth 2 (two) times daily for 5 days. 10/07/21 10/12/21 Yes Eusebio Friendly B, PA-C  ?mupirocin ointment (BACTROBAN) 2 % Apply 1 application. topically 2 (two) times daily. 10/07/21  Yes Shirlee Latch, PA-C  ?benzonatate (TESSALON) 100 MG capsule Take 2 capsules (200 mg total) by mouth every 8 (eight) hours. 05/08/21   Becky Augusta, NP  ?ipratropium (ATROVENT) 0.06 % nasal spray Place 2 sprays into both nostrils 4 (four) times daily. 05/08/21   Becky Augusta, NP  ?promethazine-dextromethorphan (PROMETHAZINE-DM) 6.25-15 MG/5ML syrup Take 5 mLs by mouth 4 (four) times daily as needed. 05/08/21   Becky Augusta, NP  ?omeprazole (PRILOSEC) 40 MG capsule Take 1 capsule (40 mg total) by mouth daily. 02/05/19 06/20/19  Emily Filbert, MD   ?sucralfate (CARAFATE) 1 g tablet Take 1 tablet (1 g total) by mouth 4 (four) times daily. 02/05/19 06/20/19  Emily Filbert, MD  ? ? ?Family History ?Family History  ?Problem Relation Age of Onset  ? Healthy Mother   ? Healthy Father   ? ? ?Social History ?Social History  ? ?Tobacco Use  ? Smoking status: Former  ?  Types: Cigars  ?  Quit date: 04/07/2017  ?  Years since quitting: 4.5  ? Smokeless tobacco: Never  ?Vaping Use  ? Vaping Use: Never used  ?Substance Use Topics  ? Alcohol use: Not Currently  ?  Comment: rare  ? Drug use: No  ? ? ? ?Allergies   ?Naproxen and Percocet [oxycodone-acetaminophen] ? ? ?Review of Systems ?Review of Systems  ?Constitutional:  Negative for fatigue and fever.  ?HENT:  Negative for facial swelling.   ?Skin:  Positive for color change and rash.  ?Neurological:  Negative for weakness.  ?Hematological:  Negative for adenopathy.  ? ? ?Physical Exam ?Triage Vital Signs ?ED Triage Vitals  ?Enc Vitals Group  ?   BP 10/07/21 1102 99/78  ?   Pulse Rate 10/07/21 1102 (!) 114  ?   Resp 10/07/21 1102 18  ?   Temp 10/07/21 1102 98.4 ?F (36.9 ?C)  ?   Temp Source 10/07/21 1102 Oral  ?   SpO2  10/07/21 1102 98 %  ?   Weight 10/07/21 1100 203 lb (92.1 kg)  ?   Height 10/07/21 1100 5\' 3"  (1.6 m)  ?   Head Circumference --   ?   Peak Flow --   ?   Pain Score 10/07/21 1059 0  ?   Pain Loc --   ?   Pain Edu? --   ?   Excl. in GC? --   ? ?No data found. ? ?Updated Vital Signs ?BP 99/78 (BP Location: Left Arm)   Pulse (!) 114   Temp 98.4 ?F (36.9 ?C) (Oral)   Resp 18   Ht 5\' 3"  (1.6 m)   Wt 203 lb (92.1 kg)   LMP 10/06/2021   SpO2 98%   BMI 35.96 kg/m?  ?   ? ?Physical Exam ?Vitals and nursing note reviewed.  ?Constitutional:   ?   General: She is not in acute distress. ?   Appearance: Normal appearance. She is not ill-appearing or toxic-appearing.  ?HENT:  ?   Head: Normocephalic and atraumatic.  ?   Nose: Nose normal.  ?   Mouth/Throat:  ?   Mouth: Mucous membranes are moist.  ?    Pharynx: Oropharynx is clear.  ?Eyes:  ?   General: No scleral icterus.    ?   Right eye: No discharge.     ?   Left eye: No discharge.  ?   Conjunctiva/sclera: Conjunctivae normal.  ?Cardiovascular:  ?   Rate and Rhythm: Regular rhythm. Tachycardia present.  ?Pulmonary:  ?   Effort: Pulmonary effort is normal. No respiratory distress.  ?Musculoskeletal:  ?   Cervical back: Neck supple.  ?Skin: ?   General: Skin is dry.  ?   Comments: 1 cm erythematous papule between brows. TTP. No drainage noted.   ?Neurological:  ?   General: No focal deficit present.  ?   Mental Status: She is alert. Mental status is at baseline.  ?   Motor: No weakness.  ?   Gait: Gait normal.  ?Psychiatric:     ?   Mood and Affect: Mood normal.     ?   Behavior: Behavior normal.     ?   Thought Content: Thought content normal.  ? ? ? ?UC Treatments / Results  ?Labs ?(all labs ordered are listed, but only abnormal results are displayed) ?Labs Reviewed - No data to display ? ?EKG ? ? ?Radiology ?No results found. ? ?Procedures ?Procedures (including critical care time) ? ?Medications Ordered in UC ?Medications - No data to display ? ?Initial Impression / Assessment and Plan / UC Course  ?I have reviewed the triage vital signs and the nursing notes. ? ?Pertinent labs & imaging results that were available during my care of the patient were reviewed by me and considered in my medical decision making (see chart for details). ? ?41 year old female presenting for an area of redness and swelling between her eyebrows for the past 6 days which is enlarging and tender.  She is afebrile and overall well-appearing.  There is an erythematous papule between her eyebrows that is tender to palpation.  No drainage.  Could be cystic acne versus small abscess.  Will cover for possibility of small abscess with 5-day course of doxycycline.  Also sent mupirocin ointment.  Advised to keep area clean and dry.  Reviewed return and ER precautions. ? ? ?Final Clinical  Impressions(s) / UC Diagnoses  ? ?Final diagnoses:  ?Papule  ?Insect bite  of head, unspecified part, initial encounter  ? ? ? ?Discharge Instructions   ? ?  ?-Keep area clean and dry ?-Apply antibiotic ointment twice daily ?-Take short course of oral antibiotics for possible infection ?-Rest and increase your fluids ?-Follow up if any worsening of symptoms ? ? ? ? ?ED Prescriptions   ? ? Medication Sig Dispense Auth. Provider  ? mupirocin ointment (BACTROBAN) 2 % Apply 1 application. topically 2 (two) times daily. 22 g Eusebio FriendlyEaves, Ferman Basilio B, PA-C  ? doxycycline (VIBRAMYCIN) 100 MG capsule Take 1 capsule (100 mg total) by mouth 2 (two) times daily for 5 days. 10 capsule Shirlee LatchEaves, Husna Krone B, PA-C  ? ?  ? ?PDMP not reviewed this encounter. ?  ?Shirlee Latchaves, Martesha Niedermeier B, PA-C ?10/07/21 1145 ? ?

## 2022-07-30 ENCOUNTER — Ambulatory Visit
Admission: EM | Admit: 2022-07-30 | Discharge: 2022-07-30 | Disposition: A | Discharge status: Home / Self Care | Payer: BC Managed Care – PPO | Attending: Emergency Medicine | Admitting: Emergency Medicine

## 2022-07-30 DIAGNOSIS — W57XXXA Bitten or stung by nonvenomous insect and other nonvenomous arthropods, initial encounter: Secondary | ICD-10-CM | POA: Diagnosis not present

## 2022-07-30 DIAGNOSIS — S80861A Insect bite (nonvenomous), right lower leg, initial encounter: Secondary | ICD-10-CM | POA: Diagnosis not present

## 2022-07-30 DIAGNOSIS — L03115 Cellulitis of right lower limb: Secondary | ICD-10-CM

## 2022-07-30 MED ORDER — MUPIROCIN 2 % EX OINT: 1.0000 | TOPICAL_OINTMENT | Freq: Two times a day (BID) | CUTANEOUS | 0 refills | Status: DC

## 2022-07-30 MED ORDER — DOXYCYCLINE HYCLATE 100 MG PO CAPS: 100.0000 mg | ORAL_CAPSULE | Freq: Two times a day (BID) | ORAL | 0 refills | Status: DC

## 2022-07-30 NOTE — ED Triage Notes (Signed)
Pt presents to UC for poss spider bite in R leg x1 wk.  Leg itchy, toes/fingers feel tingly & has a HA.

## 2022-07-30 NOTE — Discharge Instructions (Signed)
Take the Doxycycline twice daily with food for 10 days.  Apply the mupirocin ointment twice daily to the bite site for 5 days.   Apply warm compresses to help promote drainage.  Use OTC Tylenol and Ibuprofen according to the package instructions as needed for pain.  Return for new or worsening symptoms.

## 2022-07-30 NOTE — ED Provider Notes (Signed)
[facility]    CSN: [id number] Arrival date & time: 07/30/22  1445      History   Chief Complaint Chief Complaint  Patient presents with   Insect Bite    HPI Autumn Cross is a 42 y.o. female.   HPI  42 year old female here for evaluation of insect bite.  The patient has no significant past medical history she presents for possible spider bite to her right lower leg.  She states that it is itchy and she has tingling in her fingers and toes as well as a headache.  She states that she had a spider bite last year and had the exact same symptoms.  She denies any fever or drainage.  Her blood pressure is also markedly elevated at intake at 145/130 though she does not have a history of high blood pressure.  She is a former smoker but not currently.  She does not currently have a primary care provider she gets her care through the health department.  He states that she attributes this to stress at work.  Past Medical History:  Diagnosis Date   Herpes     There are no problems to display for this patient.   Past Surgical History:  Procedure Laterality Date   NO PAST SURGERIES      OB History   No obstetric history on file.      Home Medications    Prior to Admission medications   Medication Sig Start Date End Date Taking? Authorizing Provider  doxycycline (VIBRAMYCIN) 100 MG capsule Take 1 capsule (100 mg total) by mouth 2 (two) times daily. 07/30/22  Yes Margarette Canada, NP  mupirocin ointment (BACTROBAN) 2 % Apply 1 Application topically 2 (two) times daily. 07/30/22  Yes Margarette Canada, NP  omeprazole (PRILOSEC) 40 MG capsule Take 1 capsule (40 mg total) by mouth daily. 02/05/19 06/20/19  Earleen Newport, MD  sucralfate (CARAFATE) 1 g tablet Take 1 tablet (1 g total) by mouth 4 (four) times daily. 02/05/19 06/20/19  Earleen Newport, MD    Family History Family History  Problem Relation Age of Onset   Healthy Mother    Healthy Father     Social  History Social History   Tobacco Use   Smoking status: Former    Types: Cigars    Quit date: 04/07/2017    Years since quitting: 5.3   Smokeless tobacco: Never  Vaping Use   Vaping Use: Never used  Substance Use Topics   Alcohol use: Not Currently    Comment: rare   Drug use: No     Allergies   Naproxen and Percocet [oxycodone-acetaminophen]   Review of Systems Review of Systems  Constitutional:  Negative for fever.  Skin:  Positive for color change and wound.  Neurological:  Positive for numbness.     Physical Exam Triage Vital Signs ED Triage Vitals  Enc Vitals Group     BP 07/30/22 1458 (!) 145/130     Pulse Rate 07/30/22 1458 92     Resp 07/30/22 1458 16     Temp 07/30/22 1458 99.2 F (37.3 C)     Temp Source 07/30/22 1458 Oral     SpO2 07/30/22 1458 98 %     Weight 07/30/22 1457 193 lb (87.5 kg)     Height 07/30/22 1457 '5\' 3"'$  (1.6 m)     Head Circumference --      Peak Flow --      Pain Score 07/30/22  1457 0     Pain Loc --      Pain Edu? --      Excl. in [place]? --    No data found.  Updated Vital Signs BP 113/75 (BP Location: Right Arm)   Pulse 89   Temp 99.2 F (37.3 C) (Oral)   Resp 16   Ht '5\' 3"'$  (1.6 m)   Wt 193 lb (87.5 kg)   SpO2 98%   BMI 34.19 kg/m   Visual Acuity Right Eye Distance:   Left Eye Distance:   Bilateral Distance:    Right Eye Near:   Left Eye Near:    Bilateral Near:     Physical Exam Vitals and nursing note reviewed.  Constitutional:      Appearance: Normal appearance.  Skin:    General: Skin is warm and dry.     Findings: Erythema and lesion present.     Comments: Patient has 2 small erythematous lesions to her proximal and anterior lateral aspect of her right lower leg.  There is no erythema to the wound base and there is no drainage noted.  The surrounding tissue is mildly erythematous but not indurated or fluctuant.  There is no drainage noted.  Neurological:     Mental Status: She is alert.      UC  Treatments / Results  Labs (all labs ordered are listed, but only abnormal results are displayed) Labs Reviewed - No data to display  EKG   Radiology No results found.  Procedures Procedures (including critical care time)  Medications Ordered in UC Medications - No data to display  Initial Impression / Assessment and Plan / UC Course  I have reviewed the triage vital signs and the nursing notes.  Pertinent labs & imaging results that were available during my care of the patient were reviewed by me and considered in my medical decision making (see chart for details).   Is a pleasant, nontoxic-appearing 42 year old female here for possible spider bite to her right lower leg.  She does have 2 shallow erythematous lesions but there is no vesicles, induration, fluctuance, or drainage.  The areas of erythema are warm to touch and could represent developing cellulitis.  She states that she had the exact same symptoms when she was bit on her forehead last year and was treated with doxycycline and mupirocin so I will treat her accordingly.  Doxycycline 100 mg twice daily for 10 days and mupirocin with a twice daily application for 5 days.  Her blood pressure was rechecked and is within normal range at 113/75.  Work note provided.   Final Clinical Impressions(s) / UC Diagnoses   Final diagnoses:  Cellulitis of right lower extremity  Insect bite of right lower leg, initial encounter     Discharge Instructions      Take the Doxycycline twice daily with food for 10 days.  Apply the mupirocin ointment twice daily to the bite site for 5 days.   Apply warm compresses to help promote drainage.  Use OTC Tylenol and Ibuprofen according to the package instructions as needed for pain.  Return for new or worsening symptoms.       ED Prescriptions     Medication Sig Dispense Auth. Provider   doxycycline (VIBRAMYCIN) 100 MG capsule Take 1 capsule (100 mg total) by mouth 2 (two) times  daily. 20 capsule Margarette Canada, NP   mupirocin ointment (BACTROBAN) 2 % Apply 1 Application topically 2 (two) times daily. 22 g Margarette Canada,  NP      PDMP not reviewed this encounter.   Margarette Canada, NP 07/30/22 [phone]

## 2022-12-26 ENCOUNTER — Ambulatory Visit
Admission: EM | Admit: 2022-12-26 | Discharge: 2022-12-26 | Disposition: A | Discharge status: Home / Self Care | Payer: BC Managed Care – PPO | Attending: Family Medicine | Admitting: Family Medicine

## 2022-12-26 DIAGNOSIS — B379 Candidiasis, unspecified: Secondary | ICD-10-CM | POA: Diagnosis present

## 2022-12-26 DIAGNOSIS — L700 Acne vulgaris: Secondary | ICD-10-CM | POA: Insufficient documentation

## 2022-12-26 DIAGNOSIS — N760 Acute vaginitis: Secondary | ICD-10-CM | POA: Diagnosis present

## 2022-12-26 DIAGNOSIS — B9689 Other specified bacterial agents as the cause of diseases classified elsewhere: Secondary | ICD-10-CM | POA: Diagnosis present

## 2022-12-26 LAB — URINALYSIS, ROUTINE W REFLEX MICROSCOPIC
Bilirubin Urine: NEGATIVE
Glucose, UA: NEGATIVE mg/dL
Ketones, ur: NEGATIVE mg/dL
Leukocytes,Ua: NEGATIVE
Nitrite: NEGATIVE
Protein, ur: NEGATIVE mg/dL
Specific Gravity, Urine: 1.025 (ref 1.005–1.030)
pH: 5.5 (ref 5.0–8.0)

## 2022-12-26 LAB — URINALYSIS, MICROSCOPIC (REFLEX)

## 2022-12-26 LAB — WET PREP, GENITAL
Sperm: NONE SEEN
Trich, Wet Prep: NONE SEEN
WBC, Wet Prep HPF POC: <10 (ref <10)
Yeast Wet Prep HPF POC: NONE SEEN

## 2022-12-26 MED ORDER — DOXYCYCLINE HYCLATE 100 MG PO CAPS: 100.0000 mg | ORAL_CAPSULE | Freq: Two times a day (BID) | ORAL | 0 refills | Status: DC

## 2022-12-26 MED ORDER — METRONIDAZOLE 500 MG PO TABS: 500.0000 mg | ORAL_TABLET | Freq: Two times a day (BID) | ORAL | 0 refills | Status: DC

## 2022-12-26 MED ORDER — FLUCONAZOLE 150 MG PO TABS: 150.0000 mg | ORAL_TABLET | ORAL | 0 refills | Status: AC

## 2022-12-26 NOTE — Discharge Instructions (Addendum)
Stop by the pharmacy to pick up your prescriptions.  Follow up with your primary care provider as needed.  

## 2022-12-26 NOTE — ED Triage Notes (Signed)
Back pain started 4 days ago. Lower back. Feels like a kidney infection. Vaginal issues. Yeast infection.patient has pimples on her face that are hard and painful.

## 2022-12-26 NOTE — ED Provider Notes (Signed)
[facility]    CSN: [id number] Arrival date & time: 12/26/22  1531      History   Chief Complaint Chief Complaint  Patient presents with   Vaginitis   Flank Pain   Acne     HPI HPI Autumn Cross is a 42 y.o. female.    Autumn Cross presents for low back pain and flank pain, urinary frequency.  Feels like she had a kidney infection. Slight vaginal discharge but denies odor.  No dysuria, hematuria, rash, abdominal pain, pelvic pain, nausea, vomiting.  No treatment prior to arrival.  She is not concerned with STDs.  Has history of herpes.   Notes that she has a flareup of her cyst on her face and requests a refill of her medication.  She has some hard nodules on her face that are not draining Or painful.     Past Medical History:  Diagnosis Date   Herpes     There are no problems to display for this patient.   Past Surgical History:  Procedure Laterality Date   NO PAST SURGERIES      OB History   No obstetric history on file.      Home Medications    Prior to Admission medications   Medication Sig Start Date End Date Taking? Authorizing Provider  fluconazole (DIFLUCAN) 150 MG tablet Take 1 tablet (150 mg total) by mouth once a week for 2 doses. 12/26/22 01/03/23 Yes Lesley Atkin, DO  metroNIDAZOLE (FLAGYL) 500 MG tablet Take 1 tablet (500 mg total) by mouth 2 (two) times daily. 12/26/22  Yes Torey Regan, DO  doxycycline (VIBRAMYCIN) 100 MG capsule Take 1 capsule (100 mg total) by mouth 2 (two) times daily. 12/26/22   Katha Cabal, DO  mupirocin ointment (BACTROBAN) 2 % Apply 1 Application topically 2 (two) times daily. 07/30/22   Becky Augusta, NP  omeprazole (PRILOSEC) 40 MG capsule Take 1 capsule (40 mg total) by mouth daily. 02/05/19 06/20/19  Emily Filbert, MD  sucralfate (CARAFATE) 1 g tablet Take 1 tablet (1 g total) by mouth 4 (four) times daily. 02/05/19 06/20/19  Emily Filbert, MD    Family History Family History   Problem Relation Age of Onset   Healthy Mother    Healthy Father     Social History Social History   Tobacco Use   Smoking status: Former    Types: Cigars    Quit date: 04/07/2017    Years since quitting: 5.7   Smokeless tobacco: Never  Vaping Use   Vaping status: Never Used  Substance Use Topics   Alcohol use: Not Currently    Comment: rare   Drug use: No     Allergies   Naproxen and Percocet [oxycodone-acetaminophen]   Review of Systems Review of Systems: :negative unless otherwise stated in HPI.      Physical Exam Triage Vital Signs ED Triage Vitals  Encounter Vitals Group     BP 12/26/22 1549 114/73     Systolic BP Percentile --      Diastolic BP Percentile --      Pulse Rate 12/26/22 1549 71     Resp 12/26/22 1549 16     Temp 12/26/22 1547 98 F (36.7 C)     Temp Source 12/26/22 1549 Oral     SpO2 12/26/22 1549 98 %     Weight 12/26/22 1546 190 lb (86.2 kg)     Height --      Head Circumference --  Peak Flow --      Pain Score 12/26/22 1546 8     Pain Loc --      Pain Education --      Exclude from Growth Chart --    No data found.  Updated Vital Signs BP 114/73 (BP Location: Left Arm)   Pulse 71   Temp 98.5 F (36.9 C) (Oral)   Resp 16   Wt 86.2 kg   SpO2 98%   BMI 33.66 kg/m   Visual Acuity Right Eye Distance:   Left Eye Distance:   Bilateral Distance:    Right Eye Near:   Left Eye Near:    Bilateral Near:     Physical Exam GEN: well appearing female in no acute distress  CVS: well perfused  RESP: speaking in full sentences without pause  ABD: soft, non-tender, non-distended, no palpable masses, no CVA tenderness GU: deferred, patient performed self swab  SKIN: Warm, dry, multiple palpable nodules underneath the skin concerning for cystic acne, scattered pustules blackheads, hyperpigmented scars present   UC Treatments / Results  Labs (all labs ordered are listed, but only abnormal results are displayed) Labs  Reviewed  WET PREP, GENITAL - Abnormal; Notable for the following components:      Result Value   Clue Cells Wet Prep HPF POC PRESENT (*)    All other components within normal limits  URINALYSIS, ROUTINE W REFLEX MICROSCOPIC - Abnormal; Notable for the following components:   Hgb urine dipstick SMALL (*)    All other components within normal limits  URINALYSIS, MICROSCOPIC (REFLEX) - Abnormal; Notable for the following components:   Bacteria, UA RARE (*)    All other components within normal limits    EKG   Radiology No results found.  Procedures Procedures (including critical care time)  Medications Ordered in UC Medications - No data to display  Initial Impression / Assessment and Plan / UC Course  I have reviewed the triage vital signs and the nursing notes.  Pertinent labs & imaging results that were available during my care of the patient were reviewed by me and considered in my medical decision making (see chart for details).      Patient is a 42 y.o.Marland Kitchen female  who presents for urinary frequency, back pain and vaginal discharge.  Overall patient is well-appearing and afebrile.  Vital signs stable.  UA not consistent with acute cystitis.   Hematuria not supported on microscopy which suggest against a kidney stone. Wet prep showing evidence of  bacterial vaginitis but no yeast vaginitis or trichomonas however urinalysis did show budding yeast.  Spoke with lab personnel and they confirmed that there was in fact a yeast and only saw 1 on her wet prep but this was a diluted sample.  Self care instructions given including avoiding douching Gonorrhea and Chlamydia testing declined. Treatment:  Flagyl 500 BID x 7 days and advised patient to not drink alcohol while taking this medication.  Diflucan for yeast infection. Return precautions including abdominal pain, fever, chills, nausea, or vomiting given.   Acute on chronic active flareup: Refilled patient's doxycycline.  Advised to  wear sunscreen to prevent hyperpigmentation.  Discussed skin care regimen at home.  Discussed MDM, treatment plan and plan for follow-up with patient who agrees with plan.    Final Clinical Impressions(s) / UC Diagnoses   Final diagnoses:  Cystic acne vulgaris  Bacterial vaginitis  Yeast infection     Discharge Instructions      Stop by  the pharmacy to pick up your prescriptions.  Follow up with your primary care provider as needed.      ED Prescriptions     Medication Sig Dispense Auth. Provider   doxycycline (VIBRAMYCIN) 100 MG capsule Take 1 capsule (100 mg total) by mouth 2 (two) times daily. 20 capsule Nealy Hickmon, DO   fluconazole (DIFLUCAN) 150 MG tablet Take 1 tablet (150 mg total) by mouth once a week for 2 doses. 2 tablet Joffrey Kerce, DO   metroNIDAZOLE (FLAGYL) 500 MG tablet Take 1 tablet (500 mg total) by mouth 2 (two) times daily. 14 tablet Sherina Stammer, Seward Meth, DO      PDMP not reviewed this encounter.   Katha Cabal, DO 12/29/22 1132

## 2023-08-24 ENCOUNTER — Ambulatory Visit
Admission: EM | Admit: 2023-08-24 | Discharge: 2023-08-24 | Disposition: A | Discharge status: Home / Self Care | Attending: Family Medicine | Admitting: Family Medicine

## 2023-08-24 DIAGNOSIS — N760 Acute vaginitis: Secondary | ICD-10-CM | POA: Insufficient documentation

## 2023-08-24 DIAGNOSIS — L739 Follicular disorder, unspecified: Secondary | ICD-10-CM | POA: Insufficient documentation

## 2023-08-24 MED ORDER — FLUCONAZOLE 150 MG PO TABS: 150.0000 mg | ORAL_TABLET | ORAL | 0 refills | Status: AC

## 2023-08-24 MED ORDER — METRONIDAZOLE 500 MG PO TABS: 500.0000 mg | ORAL_TABLET | Freq: Two times a day (BID) | ORAL | 0 refills | Status: AC

## 2023-08-24 MED ORDER — DOXYCYCLINE HYCLATE 100 MG PO CAPS: 100.0000 mg | ORAL_CAPSULE | Freq: Two times a day (BID) | ORAL | 0 refills | Status: AC

## 2023-08-24 NOTE — ED Triage Notes (Addendum)
 Sx x 1 week  Vaginal itching  Vaginal thick discharge little smell. Fishy  Patient states that she can also be tested for STD. No exposure.  Patient also has a bump in the middle of her chest for about a week.

## 2023-08-24 NOTE — ED Provider Notes (Signed)
 [facility]    CSN: [id number] Arrival date & time: 08/24/23  0808      History   Chief Complaint Chief Complaint  Patient presents with   Vaginal Discharge   Vaginal Itching   Abscess    HPI Autumn Cross is a 43 y.o. female.    Vaginal Discharge Associated symptoms: vaginal itching   Vaginal Itching  Abscess Here for a red sore bump on her central anterior chest.  Is been there for about  7 days.  Also she has some vaginal itching and discharge.  There is a little bit of a fishy smell.  She would like to be tested for STDs though she has not had intercourse in a while.  She does tend to get yeast infections when she takes antibiotics.  She is allergic to naproxen and Percocet  Last menstrual cycle was February 18, but she states she is not at risk for pregnancy as she has not had intercourse in a while.  Past Medical History:  Diagnosis Date   Herpes     There are no active problems to display for this patient.   Past Surgical History:  Procedure Laterality Date   NO PAST SURGERIES      OB History   No obstetric history on file.      Home Medications    Prior to Admission medications   Medication Sig Start Date End Date Taking? Authorizing Provider  doxycycline (VIBRAMYCIN) 100 MG capsule Take 1 capsule (100 mg total) by mouth 2 (two) times daily for 7 days. 08/24/23 08/31/23 Yes Zenia Resides, MD  fluconazole (DIFLUCAN) 150 MG tablet Take 1 tablet (150 mg total) by mouth every 3 (three) days for 3 doses. 08/24/23 08/31/23 Yes Jaquay Posthumus, Janace Aris, MD  metroNIDAZOLE (FLAGYL) 500 MG tablet Take 1 tablet (500 mg total) by mouth 2 (two) times daily for 7 days. 08/24/23 08/31/23 Yes Zenia Resides, MD  acyclovir (ZOVIRAX) 400 MG tablet Take by mouth.    [provider]  omeprazole (PRILOSEC) 40 MG capsule Take 1 capsule (40 mg total) by mouth daily. 02/05/19 06/20/19  Emily Filbert, MD  sucralfate (CARAFATE) 1 g tablet Take 1  tablet (1 g total) by mouth 4 (four) times daily. 02/05/19 06/20/19  Emily Filbert, MD    Family History Family History  Problem Relation Age of Onset   Healthy Mother    Healthy Father     Social History Social History   Tobacco Use   Smoking status: Former    Types: Cigars    Quit date: 04/07/2017    Years since quitting: 6.3   Smokeless tobacco: Never  Vaping Use   Vaping status: Never Used  Substance Use Topics   Alcohol use: Not Currently    Comment: rare   Drug use: No     Allergies   Naproxen and Percocet [oxycodone-acetaminophen]   Review of Systems Review of Systems  Genitourinary:  Positive for vaginal discharge.     Physical Exam Triage Vital Signs ED Triage Vitals  Encounter Vitals Group     BP 08/24/23 0817 122/82     Systolic BP Percentile --      Diastolic BP Percentile --      Pulse Rate 08/24/23 0817 90     Resp 08/24/23 0817 17     Temp 08/24/23 0817 98.5 F (36.9 C)     Temp Source 08/24/23 0817 Oral     SpO2 08/24/23 0817 99 %  Weight --      Height --      Head Circumference --      Peak Flow --      Pain Score 08/24/23 0815 0     Pain Loc --      Pain Education --      Exclude from Growth Chart --    No data found.  Updated Vital Signs BP 122/82 (BP Location: Right Arm)   Pulse 90   Temp 98.5 F (36.9 C) (Oral)   Resp 17   LMP 07/16/2023 (Approximate)   SpO2 99%   Visual Acuity Right Eye Distance:   Left Eye Distance:   Bilateral Distance:    Right Eye Near:   Left Eye Near:    Bilateral Near:     Physical Exam Vitals reviewed.  Constitutional:      General: She is not in acute distress.    Appearance: She is not ill-appearing, toxic-appearing or diaphoretic.  Skin:    Coloration: Skin is not pale.     Comments: There is an erythematous bump that is tender and about 4 mm in diameter on her anterior chest overlying the upper sternum.  No fluctuance.    Neurological:     Mental Status: She is alert  and oriented to person, place, and time.  Psychiatric:        Behavior: Behavior normal.      UC Treatments / Results  Labs (all labs ordered are listed, but only abnormal results are displayed) Labs Reviewed  CERVICOVAGINAL ANCILLARY ONLY    EKG   Radiology No results found.  Procedures Procedures (including critical care time)  Medications Ordered in UC Medications - No data to display  Initial Impression / Assessment and Plan / UC Course  I have reviewed the triage vital signs and the nursing notes.  Pertinent labs & imaging results that were available during my care of the patient were reviewed by me and considered in my medical decision making (see chart for details).     Doxycycline is sent in to treat the folliculitis on her chest.  Vaginal self swab is done, and we will notify of any positives on that and treat per protocol.  Metronidazole was sent in to treat empirically for BV and Diflucan is sent in to prevent yeast infection these other antibiotics. Final Clinical Impressions(s) / UC Diagnoses   Final diagnoses:  Acute vaginitis  Folliculitis     Discharge Instructions      Staff will notify you if there is anything positive on the swab.  Take doxycycline 100 mg --1 capsule 2 times daily for 7 days  Warm compresses to the sore area on your chest can help it heal faster too  Take metronidazole 500 mg--1 tablet 2 times daily for 7 days.  Avoid drinking alcohol within 72 hours of taking this medication  Take fluconazole 150 mg--1 tablet every 3 days for 3 doses      ED Prescriptions     Medication Sig Dispense Auth. Provider   doxycycline (VIBRAMYCIN) 100 MG capsule Take 1 capsule (100 mg total) by mouth 2 (two) times daily for 7 days. 14 capsule Zenia Resides, MD   fluconazole (DIFLUCAN) 150 MG tablet Take 1 tablet (150 mg total) by mouth every 3 (three) days for 3 doses. 3 tablet Merl Bommarito, Janace Aris, MD   metroNIDAZOLE (FLAGYL) 500 MG  tablet Take 1 tablet (500 mg total) by mouth 2 (two) times daily for 7 days.  14 tablet Ahmir Bracken, Janace Aris, MD      PDMP not reviewed this encounter.   Zenia Resides, MD 08/24/23 [phone]

## 2023-08-24 NOTE — Discharge Instructions (Addendum)
 Staff will notify you if there is anything positive on the swab.  Take doxycycline 100 mg --1 capsule 2 times daily for 7 days  Warm compresses to the sore area on your chest can help it heal faster too  Take metronidazole 500 mg--1 tablet 2 times daily for 7 days.  Avoid drinking alcohol within 72 hours of taking this medication  Take fluconazole 150 mg--1 tablet every 3 days for 3 doses

## 2023-08-28 LAB — CERVICOVAGINAL ANCILLARY ONLY
Bacterial Vaginitis (gardnerella): NEGATIVE
Candida Glabrata: NEGATIVE
Candida Vaginitis: NEGATIVE
Chlamydia: NEGATIVE
Comment: NEGATIVE
Comment: NEGATIVE
Comment: NEGATIVE
Comment: NEGATIVE
Comment: NORMAL
Comment: NORMAL
Molecular Amendment: NEGATIVE
Molecular Amendment: NEGATIVE
Neisseria Gonorrhea: NEGATIVE
Trichomonas: NEGATIVE
